# Patient Record
Sex: Female | Born: 1935
Health system: Southern US, Community
[De-identification: ages and names within clinical notes are randomized; demographics above are authoritative.]

## PROBLEM LIST (undated history)

## (undated) DIAGNOSIS — F419 Anxiety disorder, unspecified: Secondary | ICD-10-CM

## (undated) DIAGNOSIS — I10 Essential (primary) hypertension: Secondary | ICD-10-CM

## (undated) DIAGNOSIS — C4401 Basal cell carcinoma of skin of lip: Secondary | ICD-10-CM

## (undated) DIAGNOSIS — Z8739 Personal history of other diseases of the musculoskeletal system and connective tissue: Secondary | ICD-10-CM

## (undated) DIAGNOSIS — C801 Malignant (primary) neoplasm, unspecified: Secondary | ICD-10-CM

## (undated) DIAGNOSIS — E785 Hyperlipidemia, unspecified: Secondary | ICD-10-CM

## (undated) DIAGNOSIS — M1990 Unspecified osteoarthritis, unspecified site: Secondary | ICD-10-CM

## (undated) DIAGNOSIS — M810 Age-related osteoporosis without current pathological fracture: Secondary | ICD-10-CM

## (undated) HISTORY — PX: OOPHORECTOMY: SHX86

## (undated) HISTORY — DX: Personal history of other diseases of the musculoskeletal system and connective tissue: Z87.39

## (undated) HISTORY — PX: PAROTID GLAND TUMOR EXCISION: SHX5221

## (undated) HISTORY — DX: Basal cell carcinoma of skin of lip: C44.01

## (undated) HISTORY — DX: Essential (primary) hypertension: I10

## (undated) HISTORY — DX: Anxiety disorder, unspecified: F41.9

## (undated) HISTORY — DX: Age-related osteoporosis without current pathological fracture: M81.0

## (undated) HISTORY — DX: Unspecified osteoarthritis, unspecified site: M19.90

## (undated) HISTORY — DX: Hyperlipidemia, unspecified: E78.5

---

## 1976-10-01 HISTORY — PX: APPENDECTOMY: SHX54

## 1978-10-01 DIAGNOSIS — F419 Anxiety disorder, unspecified: Secondary | ICD-10-CM

## 1978-10-01 HISTORY — DX: Anxiety disorder, unspecified: F41.9

## 1998-08-29 ENCOUNTER — Ambulatory Visit (HOSPITAL_COMMUNITY): Admission: RE | Admit: 1998-08-29 | Discharge: 1998-08-29 | Payer: Self-pay | Admitting: *Deleted

## 1999-07-31 ENCOUNTER — Encounter: Payer: Self-pay | Admitting: Family Medicine

## 1999-07-31 ENCOUNTER — Ambulatory Visit (HOSPITAL_COMMUNITY): Admission: RE | Admit: 1999-07-31 | Discharge: 1999-07-31 | Payer: Self-pay | Admitting: Family Medicine

## 2000-08-05 ENCOUNTER — Ambulatory Visit (HOSPITAL_COMMUNITY): Admission: RE | Admit: 2000-08-05 | Discharge: 2000-08-05 | Payer: Self-pay | Admitting: Family Medicine

## 2000-08-05 ENCOUNTER — Encounter: Payer: Self-pay | Admitting: Family Medicine

## 2001-07-01 ENCOUNTER — Other Ambulatory Visit: Admission: RE | Admit: 2001-07-01 | Discharge: 2001-07-01 | Payer: Self-pay | Admitting: Family Medicine

## 2001-10-06 ENCOUNTER — Ambulatory Visit (HOSPITAL_COMMUNITY): Admission: RE | Admit: 2001-10-06 | Discharge: 2001-10-06 | Payer: Self-pay | Admitting: Family Medicine

## 2001-10-06 ENCOUNTER — Encounter: Payer: Self-pay | Admitting: Family Medicine

## 2002-10-01 HISTORY — PX: CATARACT EXTRACTION W/ INTRAOCULAR LENS IMPLANT: SHX1309

## 2002-11-30 ENCOUNTER — Ambulatory Visit (HOSPITAL_COMMUNITY): Admission: RE | Admit: 2002-11-30 | Discharge: 2002-11-30 | Payer: Self-pay | Admitting: Family Medicine

## 2002-11-30 ENCOUNTER — Encounter: Payer: Self-pay | Admitting: Family Medicine

## 2005-03-29 ENCOUNTER — Emergency Department: Payer: Self-pay | Admitting: Emergency Medicine

## 2005-07-27 ENCOUNTER — Ambulatory Visit: Payer: Self-pay | Admitting: Family Medicine

## 2006-02-18 ENCOUNTER — Ambulatory Visit: Payer: Self-pay | Admitting: Unknown Physician Specialty

## 2006-08-08 ENCOUNTER — Ambulatory Visit: Payer: Self-pay | Admitting: Family Medicine

## 2007-09-16 ENCOUNTER — Ambulatory Visit: Payer: Self-pay | Admitting: Family Medicine

## 2008-09-16 ENCOUNTER — Ambulatory Visit: Payer: Self-pay | Admitting: Family Medicine

## 2009-10-14 ENCOUNTER — Ambulatory Visit: Payer: Self-pay | Admitting: Family Medicine

## 2010-10-25 ENCOUNTER — Ambulatory Visit: Payer: Self-pay | Admitting: Family Medicine

## 2011-03-05 ENCOUNTER — Ambulatory Visit: Payer: Self-pay | Admitting: Oral Surgery

## 2011-03-21 ENCOUNTER — Ambulatory Visit: Payer: Self-pay | Admitting: Anesthesiology

## 2011-03-26 ENCOUNTER — Ambulatory Visit: Payer: Self-pay | Admitting: Otolaryngology

## 2011-03-27 LAB — PATHOLOGY REPORT

## 2011-11-22 ENCOUNTER — Ambulatory Visit: Payer: Self-pay | Admitting: Family Medicine

## 2012-10-01 HISTORY — PX: MOHS SURGERY: SUR867

## 2013-02-12 ENCOUNTER — Ambulatory Visit: Payer: Self-pay | Admitting: Unknown Physician Specialty

## 2013-02-16 ENCOUNTER — Ambulatory Visit: Payer: Self-pay | Admitting: Family Medicine

## 2013-02-17 LAB — PATHOLOGY REPORT

## 2013-10-01 HISTORY — PX: JOINT REPLACEMENT: SHX530

## 2013-10-01 HISTORY — PX: TOTAL KNEE ARTHROPLASTY: SHX125

## 2013-10-07 ENCOUNTER — Ambulatory Visit: Payer: Self-pay | Admitting: General Practice

## 2013-10-07 DIAGNOSIS — I10 Essential (primary) hypertension: Secondary | ICD-10-CM

## 2013-10-07 LAB — CBC
HCT: 42.1 % (ref 35.0–47.0)
HGB: 14.1 g/dL (ref 12.0–16.0)
MCH: 30.9 pg (ref 26.0–34.0)
MCHC: 33.5 g/dL (ref 32.0–36.0)
MCV: 92 fL (ref 80–100)
Platelet: 176 10*3/uL (ref 150–440)
RBC: 4.57 10*6/uL (ref 3.80–5.20)
RDW: 13.2 % (ref 11.5–14.5)
WBC: 5.7 10*3/uL (ref 3.6–11.0)

## 2013-10-07 LAB — SEDIMENTATION RATE: ERYTHROCYTE SED RATE: 6 mm/h (ref 0–30)

## 2013-10-07 LAB — URINALYSIS, COMPLETE
Bacteria: NONE SEEN
Bilirubin,UR: NEGATIVE
Blood: NEGATIVE
GLUCOSE, UR: NEGATIVE mg/dL (ref 0–75)
Ketone: NEGATIVE
Leukocyte Esterase: NEGATIVE
Nitrite: NEGATIVE
PROTEIN: NEGATIVE
Ph: 6 (ref 4.5–8.0)
Specific Gravity: 1.012 (ref 1.003–1.030)

## 2013-10-07 LAB — BASIC METABOLIC PANEL
Anion Gap: 4 — ABNORMAL LOW (ref 7–16)
BUN: 13 mg/dL (ref 7–18)
Calcium, Total: 9.5 mg/dL (ref 8.5–10.1)
Chloride: 104 mmol/L (ref 98–107)
Co2: 29 mmol/L (ref 21–32)
Creatinine: 0.63 mg/dL (ref 0.60–1.30)
EGFR (African American): >60
EGFR (Non-African Amer.): >60
Glucose: 89 mg/dL (ref 65–99)
Osmolality: 273 (ref 275–301)
Potassium: 3.4 mmol/L — ABNORMAL LOW (ref 3.5–5.1)
Sodium: 137 mmol/L (ref 136–145)

## 2013-10-07 LAB — APTT: Activated PTT: 27.4 secs (ref 23.6–35.9)

## 2013-10-07 LAB — PROTIME-INR
INR: 1.2
Prothrombin Time: 14.7 secs (ref 11.5–14.7)

## 2013-10-07 LAB — MRSA PCR SCREENING

## 2013-10-08 LAB — URINE CULTURE

## 2013-10-21 ENCOUNTER — Inpatient Hospital Stay: Payer: Self-pay | Admitting: General Practice

## 2013-10-22 LAB — BASIC METABOLIC PANEL
Anion Gap: 5 — ABNORMAL LOW (ref 7–16)
BUN: 15 mg/dL (ref 7–18)
CALCIUM: 8.2 mg/dL — AB (ref 8.5–10.1)
CHLORIDE: 107 mmol/L (ref 98–107)
CO2: 26 mmol/L (ref 21–32)
Creatinine: 0.87 mg/dL (ref 0.60–1.30)
EGFR (Non-African Amer.): >60
Glucose: 127 mg/dL — ABNORMAL HIGH (ref 65–99)
Osmolality: 278 (ref 275–301)
Potassium: 4.3 mmol/L (ref 3.5–5.1)
Sodium: 138 mmol/L (ref 136–145)

## 2013-10-22 LAB — PLATELET COUNT: Platelet: 173 10*3/uL (ref 150–440)

## 2013-10-22 LAB — HEMOGLOBIN: HGB: 12.6 g/dL (ref 12.0–16.0)

## 2013-10-23 LAB — BASIC METABOLIC PANEL
Anion Gap: 3 — ABNORMAL LOW (ref 7–16)
BUN: 14 mg/dL (ref 7–18)
CALCIUM: 8.7 mg/dL (ref 8.5–10.1)
CREATININE: 0.81 mg/dL (ref 0.60–1.30)
Chloride: 107 mmol/L (ref 98–107)
Co2: 27 mmol/L (ref 21–32)
EGFR (African American): >60
GLUCOSE: 103 mg/dL — AB (ref 65–99)
Osmolality: 275 (ref 275–301)
Potassium: 4.1 mmol/L (ref 3.5–5.1)
SODIUM: 137 mmol/L (ref 136–145)

## 2013-10-23 LAB — HEMOGLOBIN: HGB: 12.1 g/dL (ref 12.0–16.0)

## 2013-10-23 LAB — PLATELET COUNT: Platelet: 152 10*3/uL (ref 150–440)

## 2014-02-24 ENCOUNTER — Ambulatory Visit: Payer: Self-pay | Admitting: Family Medicine

## 2014-05-24 ENCOUNTER — Ambulatory Visit: Payer: Self-pay | Admitting: Family Medicine

## 2015-01-22 NOTE — Op Note (Signed)
PATIENT NAME:  Christy Mills, Christy Mills MR#:  [id number] DATE OF BIRTH:  [birth year]  DATE OF PROCEDURE:  10/21/2013  PREOPERATIVE DIAGNOSIS:  Degenerative arthrosis of the right knee.   POSTOPERATIVE DIAGNOSIS:  Degenerative arthrosis of the right knee.   PROCEDURE PERFORMED:  Right total knee arthroplasty using computer-assisted navigation.   SURGEON:  Laurice Record. Hooten, M.D.   ASSISTANT:  Vance Peper, PA (required to maintain retraction throughout the procedure).   ANESTHESIA:  Spinal.   ESTIMATED BLOOD LOSS:  50 mL.   FLUIDS REPLACED:  1400 mL of crystalloid.   TOURNIQUET TIME:  92 minutes.   DRAINS:  Two medium drains to reinfusion system.   SOFT TISSUE RELEASES:  Anterior cruciate ligament, posterior cruciate ligament, deep medial collateral ligament and patellofemoral ligament.   IMPLANTS UTILIZED:  DePuy PFC Sigma size three posterior stabilized femoral component (cemented), size three MBT tibial component (cemented), 35 mm three peg oval dome patella (cemented), and a 12.5 mm stabilized rotating platform polyethylene insert.   INDICATIONS FOR SURGERY:  The patient is a 81 year old female who has been seen for complaints of progressive right knee pain.  X-rays demonstrated severe degenerative changes in tricompartmental fashion with relative varus deformity.  After discussion of the risks and benefits of surgical intervention, the patient expressed understanding of the risks and benefits and agreed with plans for surgical intervention.   PROCEDURE IN DETAIL:  The patient was brought into the Operating Room and, after adequate spinal anesthesia was achieved, a tourniquet was placed on the patient's upper right thigh.  The patient's right knee and leg were cleaned and prepped with alcohol and DuraPrep, draped in the usual sterile fashion.  A "timeout" was performed as per usual protocol.  The right lower extremity was exsanguinated using an Esmarch, and the tourniquet was inflated to 300 mmHg.  An  anterior longitudinal incision was made followed by a standard mid vastus approach.  A moderate effusion was evacuated.  Deep fibers of the medial collateral ligament were elevated in a subperiosteal fashion off of the medial flare of the tibia.  The patella was subluxed laterally and the patellofemoral ligament was incised.  Inspection of the knee demonstrated severe degenerative changes in a tricompartmental fashion.  Prominent osteophytes were debrided using a rongeur.  The anterior and posterior cruciate ligaments were excised.  Two 4.0 mm Schanz pins were inserted and the femur and into the tibia for attachment of the ray of trackers used for computer-assisted navigation.  Hip center was identified using circumduction technique.  Distal landmarks were mapped using the computer.  The distal femur and proximal tibia were mapped using the computer.  Distal femoral cutting guide was positioned using computer-assisted navigation so as to achieve a 5 degree distal valgus cut.  Cut was performed and verified using the computer.  Distal femur was sized, and it was felt that a size three femur was appropriate.  A size three cutting guide was positioned and the anterior cut was performed and verified using the computer.  This was followed by completion of the posterior and chamfer cuts.  Femoral cutting guide for central box was then positioned and the central box cut was performed.  Attention was then directed to the proximal tibia.  Medial and lateral menisci were excised.  The extramedullary tibial cutting guide was positioned using computer-assisted navigation so as to achieve a 0 degree varus valgus alignment and 0 degree posterior slope.  Cut was performed and verified using the computer.  The proximal tibia  was sized and it was felt that a size three tibial tray was appropriate.  Tibial and femoral trials were inserted followed by insertion of first a 10 and subsequently a 12.5 mm polyethylene trial.  This allowed  for excellent mediolateral soft tissue balancing both in full extension and in flexion.  Finally, the patella was cut and prepared so as to accommodate a 35 mm three peg oval dome patella.  Patellar trial was placed and the knee was placed through a range of motion with excellent patellar tracking appreciated.  The femoral trial was removed.  Central post hole for the tibial component was reamed followed by insertion of a keel punch.  Tibial trial was then removed.  The cut surfaces of bone were irrigated with copious amounts of normal saline with antibiotic solution using pulsatile lavage and then suctioned dry.  Polymethyl methacrylate cement was prepared in the usual fashion using a vacuum mixer.  Cement was applied to the cut surface of the proximal tibia as well as along the undersurface of a size three MBT tibial component.  The tibial component was positioned and impacted into place.  Excess cement was removed using [employer] fast streamer.  Cement was then applied to the cut surface of the femur as well as on the posterior flanges of a size three posterior stabilized femoral component.  Femoral component was positioned and impacted into place.  Excess cement was removed using freer elevators.  A 12.5 mm polyethylene trial was inserted and the knee was brought in full extension with steady axial compression applied.  Finally, cement was applied to the backside of a 35 mm three peg oval dome patella, and the patellar component was positioned and patellar clamp applied.  Excess cement was removed using freer elevators.   After adequate curing of cement, the tourniquet was deflated after a total tourniquet time of 92 minutes.  Hemostasis was achieved using electrocautery.  The knee was irrigated with copious amounts of normal saline with antibiotic solution using pulsatile lavage and then suctioned dry.  The knee was inspected for any residual cement debris.  20 mL of 1.3% Exparel in 40 mL of normal saline was  injected along the posterior capsule, medial and lateral gutters, and along the arthrotomy site.  A 12.5 mm stabilized rotating platform polyethylene insert was inserted and the knee was placed through a range of motion.  Excellent mediolateral soft tissue balancing was appreciated and excellent patellar tracking appreciated.  Two medium drains were placed in the wound bed and brought out through a separate stab incision to be attached to a reinfusion system.  The medial parapatellar portion of the incision was reapproximated using interrupted sutures of #1 Vicryl.  The subcutaneous tissue was injected with 0.25% Marcaine with epinephrine.  The subcutaneous tissue was then reapproximated in layers using first #0 Vicryl followed by #2-0 Vicryl.  Skin was closed with skin staples.  A sterile dressing was applied.   The patient tolerated the procedure well.  She was transported to the recovery room in stable condition.    ____________________________ Laurice Record. Holley Bouche., MD jph:ea D: 10/22/2013 06:34:00 ET T: 10/22/2013 07:08:04 ET JOB#: [id number]  cc: Laurice Record. Holley Bouche., MD, <Dictator> JAMES P Holley Bouche MD ELECTRONICALLY SIGNED 11/20/2013 6:38

## 2015-01-22 NOTE — Discharge Summary (Signed)
PATIENT NAME:  Christy Mills, Christy Mills MR#:  [id number] DATE OF BIRTH:  25-Feb-1934  DATE OF ADMISSION:  10/21/2013 DATE OF DISCHARGE:  10/23/2013  ADMITTING DIAGNOSIS: Degenerative arthrosis of the right knee.   DISCHARGE DIAGNOSIS: Degenerative arthrosis of the right knee.   OPERATION: On 10/21/2013, the patient had a right total knee arthroplasty using computer-aided navigation.   SURGEON: Skip Estimable, M.D.   ASSISTANT: Vance Peper, PA.   ANESTHESIA: Spinal.   ESTIMATED BLOOD LOSS: 50 mL.   TOURNIQUET TIME: 92 minutes.   DRAINS: Two medium drains to reinfusion system.   IMPLANTS USED: DePuy PFC Sigma size 3 posterior stabilized femoral component that was cemented, size 3 MBT tibial component that was cemented, 35 mm 3 peg oval dome patella that was cemented, a 12.5 mm stabilized rotating platform polyethylene insert.   The patient was stabilized, brought to the recovery room and then brought down to the orthopedic floor.   HISTORY AND PHYSICAL: HISTORY: The patient is a 81 year old female who presented for an upcoming total knee replacement on the right knee. The patient has a long history of progressive right knee pain. It has been refractory to conservative treatment, including arthroscopy, cortisone injections and anti-inflammatories. The patient has had difficulties with daily living.   PHYSICAL EXAMINATION:  GENERAL: Alert female with an antalgic gait and a varus thrust to the right knee.  LUNGS: Clear to auscultation.  CARDIOVASCULAR: Normal sinus rhythm.  MUSCULOSKELETAL: In regard to the right knee, the patient has no effusion. The patient does have medial joint line tenderness and no instability. The patient has range of motion with full extension to 122 degrees of flexion.   HOSPITAL COURSE: After initial admission on 10/21/2013, the patient was brought to the orthopedic floor. The patient on day one was ambulating bed to chair and progressed up to ambulating around the nurse's  station. She was ready to go home with home health physical therapy on 10/23/2013. The patient had no other specific complications.   CONDITION AT DISCHARGE: Stable.   DISPOSITION: The patient was sent home with home health physical therapy.   DISCHARGE INSTRUCTIONS: The patient will follow up with [facility] on 11/05/2013 with Vance Peper. The patient will do weight bear as tolerated on the affected leg. The patient will raise her foot with 1 to 2 pillows. The patient will use thigh high TED hose on both legs, removed at bedtime. The patient will keep her heels off the bed and be encouraged to do cough and deep breathing. The patient will do a regular diet. The patient will use a Polar Care to decrease swelling, try to keep her dressing clean and dry, try not to get it dirty. The patient will do physical therapy as per protocol and will have a dressing change with physical therapy. The patient will call the clinic if there is any bright red bleeding, calf pain, bowel or bladder difficulty or fever greater than 101.5.   DISCHARGE MEDICATIONS: Resume home medications and then to add Celebrex 200 mg 1 tablet b.i.d., Tylenol 500 mg q.4 hours as needed for pain or fever greater than 100.4, oxycodone 5 mg 1 tablet q.4 hours for severe pain, tramadol 50 mg 1 tablet q.4 hours as needed for less severe pain and Lovenox 40 mg subcutaneous once a day for 14 days, then discontinue and begin an aspirin 81 mg once a day.    ____________________________ Lenna Sciara. Reche Dixon, [place] jtm:gb D: 10/23/2013 06:05:28 ET T: 10/23/2013 06:19:01 ET JOB#: [id number]  cc: Anitra Lauth, PA, <Dictator> J Keean Wilmeth PA ELECTRONICALLY SIGNED 10/26/2013 10:07

## 2015-02-11 ENCOUNTER — Other Ambulatory Visit: Payer: Self-pay | Admitting: Family Medicine

## 2015-02-11 DIAGNOSIS — Z1231 Encounter for screening mammogram for malignant neoplasm of breast: Secondary | ICD-10-CM

## 2015-03-03 ENCOUNTER — Ambulatory Visit
Admission: RE | Admit: 2015-03-03 | Discharge: 2015-03-03 | Disposition: A | Discharge status: Home / Self Care | Payer: Medicare PPO | Source: Ambulatory Visit | Attending: Family Medicine | Admitting: Family Medicine

## 2015-03-03 DIAGNOSIS — Z1231 Encounter for screening mammogram for malignant neoplasm of breast: Secondary | ICD-10-CM | POA: Insufficient documentation

## 2015-03-03 DIAGNOSIS — R922 Inconclusive mammogram: Secondary | ICD-10-CM | POA: Insufficient documentation

## 2015-03-24 ENCOUNTER — Other Ambulatory Visit: Payer: Self-pay | Admitting: Family Medicine

## 2015-03-24 DIAGNOSIS — E785 Hyperlipidemia, unspecified: Secondary | ICD-10-CM | POA: Insufficient documentation

## 2015-03-24 DIAGNOSIS — I10 Essential (primary) hypertension: Secondary | ICD-10-CM

## 2015-03-24 DIAGNOSIS — M810 Age-related osteoporosis without current pathological fracture: Secondary | ICD-10-CM | POA: Insufficient documentation

## 2015-03-31 ENCOUNTER — Encounter: Payer: Self-pay | Admitting: Family Medicine

## 2015-05-25 ENCOUNTER — Other Ambulatory Visit: Payer: Self-pay | Admitting: Family Medicine

## 2015-05-25 DIAGNOSIS — F419 Anxiety disorder, unspecified: Secondary | ICD-10-CM

## 2015-05-26 DIAGNOSIS — F419 Anxiety disorder, unspecified: Secondary | ICD-10-CM | POA: Insufficient documentation

## 2015-05-26 NOTE — Telephone Encounter (Signed)
Ok to call in rx.  Thanks.  

## 2015-05-26 NOTE — Telephone Encounter (Signed)
Last ov 01/2015

## 2015-08-17 ENCOUNTER — Other Ambulatory Visit: Payer: Self-pay | Admitting: Family Medicine

## 2015-08-17 DIAGNOSIS — I10 Essential (primary) hypertension: Secondary | ICD-10-CM

## 2015-08-17 NOTE — Telephone Encounter (Signed)
Last OV 01/2015  Thanks,   -Christy Mills  

## 2015-08-19 ENCOUNTER — Other Ambulatory Visit: Payer: Self-pay | Admitting: Family Medicine

## 2015-08-19 DIAGNOSIS — F419 Anxiety disorder, unspecified: Secondary | ICD-10-CM

## 2015-08-19 NOTE — Telephone Encounter (Signed)
Last OV 02/18/2015

## 2015-08-19 NOTE — Telephone Encounter (Signed)
Ok to call in rx.  Thanks.  

## 2015-09-12 ENCOUNTER — Ambulatory Visit (INDEPENDENT_AMBULATORY_CARE_PROVIDER_SITE_OTHER): Payer: Medicare PPO | Admitting: Family Medicine

## 2015-09-12 ENCOUNTER — Encounter: Payer: Self-pay | Admitting: Family Medicine

## 2015-09-12 VITALS — BP 124/62 | HR 65 | Temp 97.6°F | Resp 16 | Wt 205.4 lb

## 2015-09-12 DIAGNOSIS — M1990 Unspecified osteoarthritis, unspecified site: Secondary | ICD-10-CM | POA: Insufficient documentation

## 2015-09-12 DIAGNOSIS — I10 Essential (primary) hypertension: Secondary | ICD-10-CM | POA: Insufficient documentation

## 2015-09-12 DIAGNOSIS — E876 Hypokalemia: Secondary | ICD-10-CM | POA: Insufficient documentation

## 2015-09-12 DIAGNOSIS — E7800 Pure hypercholesterolemia, unspecified: Secondary | ICD-10-CM | POA: Insufficient documentation

## 2015-09-12 DIAGNOSIS — J069 Acute upper respiratory infection, unspecified: Secondary | ICD-10-CM

## 2015-09-12 MED ORDER — HYDROCODONE-HOMATROPINE 5-1.5 MG/5ML PO SYRP: ORAL_SOLUTION | ORAL | Status: DC

## 2015-09-12 NOTE — Patient Instructions (Signed)
Discussed use of Sudafed PE, Mucinex, saline spray, and Delsym.

## 2015-09-12 NOTE — Progress Notes (Signed)
Subjective:     Patient ID: Christy Mills, female   DOB: Oct 12, 1933, 81 y.o.   MRN: [id number]  HPI  Chief Complaint  Patient presents with  . Cough    Patient comes in office today with concerns of cough and congestion x 24hrs. Patient states that she has sinus pressure (entire head) and nasal congestion. Patient reports taking otc Robitussin and allergy relief     Review of Systems  Constitutional: Negative for fever and chills.       Objective:   Physical Exam  Constitutional: She appears well-developed and well-nourished. No distress.  HENT:  Hard of hearing with bilateral hearing aides  Ears: T.M's intact without inflammation Throat: no tonsillar enlargement or exudate Neck: no cervical adenopathy Lungs: clear     Assessment:    1. Upper respiratory infection - HYDROcodone-homatropine (HYCODAN) 5-1.5 MG/5ML syrup; 5 ml 6-8 hours as needed for cough  Dispense: 240 mL; Refill: 0    Plan:    Discussed otc medication use.

## 2015-09-19 ENCOUNTER — Telehealth: Payer: Self-pay | Admitting: Family Medicine

## 2015-09-19 ENCOUNTER — Other Ambulatory Visit: Payer: Self-pay | Admitting: Family Medicine

## 2015-09-19 DIAGNOSIS — J019 Acute sinusitis, unspecified: Secondary | ICD-10-CM

## 2015-09-19 MED ORDER — AMOXICILLIN-POT CLAVULANATE 875-125 MG PO TABS
1.0000 | ORAL_TABLET | Freq: Two times a day (BID) | ORAL | Status: DC
Start: 2015-09-19 — End: 2015-12-19

## 2015-09-19 NOTE — Telephone Encounter (Signed)
Please review office note and advise. KW 

## 2015-09-19 NOTE — Telephone Encounter (Signed)
Pt states she was seen last week for a cough.  Pt states as od Thursday she started having sinus congestion and sinus pressure. Pt is requesting a Rx to help with this.  Pt states he cough is better.  [facility].  [id number]

## 2015-09-19 NOTE — Telephone Encounter (Signed)
Patient has been notified. KW

## 2015-09-19 NOTE — Telephone Encounter (Signed)
Antibiotic has been sent in to cover the possibility of sinus infection.

## 2015-10-12 ENCOUNTER — Other Ambulatory Visit: Payer: Self-pay | Admitting: Family Medicine

## 2015-10-12 DIAGNOSIS — E785 Hyperlipidemia, unspecified: Secondary | ICD-10-CM

## 2015-10-12 DIAGNOSIS — I10 Essential (primary) hypertension: Secondary | ICD-10-CM

## 2015-11-16 ENCOUNTER — Other Ambulatory Visit: Payer: Self-pay | Admitting: Family Medicine

## 2015-11-18 ENCOUNTER — Other Ambulatory Visit: Payer: Self-pay | Admitting: Family Medicine

## 2015-11-18 DIAGNOSIS — F419 Anxiety disorder, unspecified: Secondary | ICD-10-CM

## 2015-11-18 NOTE — Telephone Encounter (Signed)
Printed, please fax or call in to pharmacy. Thank you.   

## 2015-11-18 NOTE — Telephone Encounter (Signed)
Pt called checking on the refills for her LORazepam (ATIVAN) 1 MG tablet raloxifene (EVISTA) 60 MG tablet  PT Call back [phone]   Thanks teri

## 2015-11-23 ENCOUNTER — Other Ambulatory Visit: Payer: Self-pay | Admitting: Family Medicine

## 2015-11-23 DIAGNOSIS — M810 Age-related osteoporosis without current pathological fracture: Secondary | ICD-10-CM

## 2015-11-23 MED ORDER — RALOXIFENE HCL 60 MG PO TABS
60.0000 mg | ORAL_TABLET | Freq: Every day | ORAL | Status: DC
Start: 2015-11-23 — End: 2016-02-18

## 2015-11-23 NOTE — Telephone Encounter (Signed)
Pt has only been seen recently for acute problems. Last FU/CPE was August 2015. Renaldo Fiddler, CMA

## 2015-11-23 NOTE — Telephone Encounter (Signed)
LMTCB

## 2015-11-23 NOTE — Telephone Encounter (Signed)
Sent rx. Please notify patient needs Wellness scheduled in next month or so. Thanks.

## 2015-11-23 NOTE — Telephone Encounter (Signed)
Pt contacted office for refill request on the following medications: raloxifene (EVISTA) [facility] MG [facility] stated she has been trying to get this refilled for over a week. Pt would like it sent in as soon as possible for 90 supply. Pt would like a call back if this can not be refilled. Please advise. Thanks TNP

## 2015-12-19 ENCOUNTER — Encounter: Payer: Self-pay | Admitting: Family Medicine

## 2015-12-19 ENCOUNTER — Ambulatory Visit (INDEPENDENT_AMBULATORY_CARE_PROVIDER_SITE_OTHER): Payer: PPO | Admitting: Family Medicine

## 2015-12-19 VITALS — BP 120/64 | HR 74 | Temp 98.4°F | Resp 16 | Ht 63.0 in | Wt 205.0 lb

## 2015-12-19 DIAGNOSIS — R7309 Other abnormal glucose: Secondary | ICD-10-CM | POA: Diagnosis not present

## 2015-12-19 DIAGNOSIS — F419 Anxiety disorder, unspecified: Secondary | ICD-10-CM

## 2015-12-19 DIAGNOSIS — I10 Essential (primary) hypertension: Secondary | ICD-10-CM | POA: Diagnosis not present

## 2015-12-19 DIAGNOSIS — R350 Frequency of micturition: Secondary | ICD-10-CM | POA: Diagnosis not present

## 2015-12-19 DIAGNOSIS — K219 Gastro-esophageal reflux disease without esophagitis: Secondary | ICD-10-CM

## 2015-12-19 DIAGNOSIS — Z0000 Encounter for general adult medical examination without abnormal findings: Secondary | ICD-10-CM

## 2015-12-19 DIAGNOSIS — E7800 Pure hypercholesterolemia, unspecified: Secondary | ICD-10-CM

## 2015-12-19 MED ORDER — LISINOPRIL 40 MG PO TABS: 40.0000 mg | ORAL_TABLET | Freq: Every day | ORAL | Status: DC

## 2015-12-19 MED ORDER — LORAZEPAM 1 MG PO TABS: 1.0000 mg | ORAL_TABLET | Freq: Every day | ORAL | Status: DC

## 2015-12-19 MED ORDER — OMEPRAZOLE 20 MG PO CPDR: 20.0000 mg | DELAYED_RELEASE_CAPSULE | Freq: Every day | ORAL | Status: DC

## 2015-12-19 NOTE — Progress Notes (Signed)
Patient ID: Christy Mills, female   DOB: [birth year], 82 y.o.   MRN: [id number]       Patient: Christy Mills, Female    DOB: [birth year], 82 y.o.   MRN: [id number] Visit Date: 12/19/2015  Today's Provider: Margarita Rana, MD   Chief Complaint  Patient presents with  . Medicare Wellness  . Gastroesophageal Reflux   Subjective:    Annual wellness visit Christy Mills is a 82 y.o. female. She feels well. She reports exercising 3-4 days a week. She reports she is sleeping well.  05/14/15 CPE 03/23/15 Mammogram-BI-RADS 1 05/24/14 BMD-osteoporosis 03/014/11 Pap-neg HPV-neg 02/12/13 Colonoscopy-polyp, diverticulosis, internal hemorrhoids, recheck 01/2018 -----------------------------------------------------------  GERD: Paitent complains of heartburn. This has been associated with heartburn.  She denies unexpected weight loss. Symptoms have been present for 1 year. She denies dysphagia.  She has not lost weight. She denies melena, hematochezia, hematemesis, and coffee ground emesis. Medical therapy in the past has included omeprazole 20 mg. Patient is requesting prescription.  Bladder problem:  Patient reports that she has been have frequency and the feeling of not voiding completely. Patient reports this has been going on for a few years. Patient denies pain or burning with urination.  Review of Systems  Constitutional: Negative.   HENT: Negative.   Eyes: Negative.   Respiratory: Negative.   Cardiovascular: Negative.   Gastrointestinal: Negative.   Endocrine: Negative.   Genitourinary: Positive for frequency and enuresis.  Musculoskeletal: Positive for arthralgias.  Skin: Negative.   Allergic/Immunologic: Negative.   Neurological: Negative.   Hematological: Bruises/bleeds easily.  Psychiatric/Behavioral: Negative.     Social History   Social History  . Marital Status: Married    Spouse Name: N/A  . Number of Children: N/A  . Years of Education: N/A   Occupational History    . Not on file.   Social History Main Topics  . Smoking status: Former Smoker    Types: Cigarettes  . Smokeless tobacco: Never Used     Comment: quit in 1992  . Alcohol Use: No  . Drug Use: No  . Sexual Activity: Not on file   Other Topics Concern  . Not on file   Social History Narrative    Past Medical History  Diagnosis Date  . Arthritis   . Hyperlipidemia   . Hypertension   . Osteoporosis   . Anxiety   . Hypokalemia   . History of trigger finger      Patient Active Problem List   Diagnosis Date Noted  . Arthritis 09/12/2015  . Hypercholesterolemia 09/12/2015  . BP (high blood pressure) 09/12/2015  . Anxiety 05/26/2015  . Hyperlipemia 03/24/2015  . Osteoporosis 03/24/2015  . Hypertension 03/24/2015    Past Surgical History  Procedure Laterality Date  . Total knee arthroplasty Right 10/2013  . Appendectomy  1978  . 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 Oophorectomy Right     Her family history includes Breast cancer (age of onset: 57) in her mother; Cancer in her brother and father; Colon polyps in her father; Diabetes in her brother and father; Hypertension in her father and mother.    Previous Medications   ACETAMINOPHEN (TYLENOL) 500 MG TABLET    Take 500 mg by mouth every 6 (six) hours as needed.   AMLODIPINE (NORVASC) 5 MG TABLET    TAKE 1 TABLET BY MOUTH EVERY DAY   CALCIUM CARBONATE-VIT D-MIN (CALCIUM 1200 PO)    Take 1  capsule by mouth daily.   GLUCOSAMINE 500 MG CAPS    Take 1 capsule by mouth daily.   LISINOPRIL (PRINIVIL,ZESTRIL) 40 MG TABLET    TAKE 1 TABLET BY MOUTH DAILY   LORAZEPAM (ATIVAN) 1 MG TABLET    TAKE 1 TABLET BY MOUTH EVERY DAY   MULTIPLE VITAMIN (MULTIVITAMIN) CAPSULE    Take 1 capsule by mouth daily.   RALOXIFENE (EVISTA) 60 MG TABLET    Take 1 tablet (60 mg total) by mouth daily.   SIMVASTATIN (ZOCOR) 20 MG TABLET    TAKE 1 TABLET BY MOUTH EVERY DAY    Patient Care Team: Margarita Rana, MD as  PCP - General (Family Medicine)     Objective:   Vitals: BP 120/64 mmHg  Pulse 74  Temp(Src) 98.4 F (36.9 C) (Oral)  Resp 16  Ht 5\' 3"  (1.6 m)  Wt 205 lb (92.987 kg)  BMI 36.32 kg/m2  SpO2 96%  Physical Exam  Constitutional: She is oriented to person, place, and time. She appears well-developed and well-nourished.  HENT:  Head: Normocephalic and atraumatic.  Right Ear: Tympanic membrane, external ear and ear canal normal.  Left Ear: Tympanic membrane, external ear and ear canal normal.  Nose: Nose normal.  Mouth/Throat: Uvula is midline, oropharynx is clear and moist and mucous membranes are normal.  Eyes: Conjunctivae, EOM and lids are normal. Pupils are equal, round, and reactive to light.  Neck: Trachea normal and normal range of motion. Neck supple. Carotid bruit is not present. No thyroid mass and no thyromegaly present.  Cardiovascular: Normal rate, regular rhythm and normal heart sounds.   Pulmonary/Chest: Effort normal and breath sounds normal.  Abdominal: Soft. Normal appearance and bowel sounds are normal. There is no hepatosplenomegaly. There is no tenderness.  Genitourinary: No breast swelling, tenderness or discharge.  Musculoskeletal: Normal range of motion.  Lymphadenopathy:    She has no cervical adenopathy.    She has no axillary adenopathy.  Neurological: She is alert and oriented to person, place, and time. She has normal strength. No cranial nerve deficit.  Skin: Skin is warm, dry and intact.  Psychiatric: She has a normal mood and affect. Her speech is normal and behavior is normal. Judgment and thought content normal. Cognition and memory are normal.    Activities of Daily Living In your present state of health, do you have any difficulty performing the following activities: 12/19/2015  Hearing? Y  Vision? N  Difficulty concentrating or making decisions? N  Walking or climbing stairs? Y  Dressing or bathing? N  Doing errands, shopping? N    Fall  Risk Assessment Fall Risk  12/19/2015  Falls in the past year? No     Depression Screen PHQ 2/9 Scores 12/19/2015  PHQ - 2 Score 0    Cognitive Testing - 6-CIT  Correct? Score   What year is it? yes 0 0 or 4  What month is it? yes 0 0 or 3  Memorize:    Pia Mau,  44,  [place],      What time is it? (within 1 hour) yes 0 0 or 3  Count backwards from 20 yes 0 0, 2, or 4  Name the months of the year yes 0 0, 2, or 4  Repeat name & address above yes 0 0, 2, 4, 6, 8, or 10       TOTAL SCORE  0/28   Interpretation:  Normal  Normal (0-7) Abnormal (8-28)  Assessment & Plan:     Annual Wellness Visit  Reviewed patient's Family Medical History Reviewed and updated list of patient's medical providers Assessment of cognitive impairment was done Assessed patient's functional ability Established a written schedule for health screening [place] Completed and Reviewed  Exercise Activities and Dietary recommendations Goals    None          1. Medicare annual wellness visit, subsequent Stable. Patient advised to continue eating healthy and exercise daily.  2. Essential hypertension Stable. Patient advised to continue current medication and plan of care. - CBC with Differential/Platelet - Comprehensive metabolic panel - lisinopril (PRINIVIL,ZESTRIL) 40 MG tablet; Take 1 tablet (40 mg total) by mouth daily.  Dispense: 90 tablet; Refill: 3  3. Hypercholesterolemia F/U pending lab report. - Lipid Panel With LDL/HDL Ratio - TSH  4. Abnormal blood sugar - Hemoglobin A1c  5. Gastroesophageal reflux disease, esophagitis presence not specified New problem. Patient started on omeprazole 20 mg daily as below. - omeprazole (PRILOSEC) 20 MG capsule; Take 1 capsule (20 mg total) by mouth daily.  Dispense: 90 capsule; Refill: 3  6. Anxiety Stable. Patient advised to continue current medication and plan of care. - LORazepam (ATIVAN) 1 MG  tablet; Take 1 tablet (1 mg total) by mouth daily.  Dispense: 90 tablet; Refill: 1   7. Urinary frequency Recurrent.     Patient seen and examined by Dr. Jerrell Belfast, and note scribed by Philbert Riser. Dim[place], CMA.  I have reviewed the document for accuracy and completeness and I agree with above. Jerrell Belfast, MD   Margarita Rana, MD    ------------------------------------------------------------------------------------------------------------

## 2015-12-21 DIAGNOSIS — I10 Essential (primary) hypertension: Secondary | ICD-10-CM | POA: Diagnosis not present

## 2015-12-21 DIAGNOSIS — R7309 Other abnormal glucose: Secondary | ICD-10-CM | POA: Diagnosis not present

## 2015-12-21 DIAGNOSIS — E7800 Pure hypercholesterolemia, unspecified: Secondary | ICD-10-CM | POA: Diagnosis not present

## 2015-12-22 ENCOUNTER — Telehealth: Payer: Self-pay

## 2015-12-22 LAB — CBC WITH DIFFERENTIAL/PLATELET
BASOS ABS: 0 10*3/uL (ref 0.0–0.2)
Basos: 1 %
EOS (ABSOLUTE): 0.2 10*3/uL (ref 0.0–0.4)
Eos: 3 %
HEMOGLOBIN: 15 g/dL (ref 11.1–15.9)
Hematocrit: 45.5 % (ref 34.0–46.6)
IMMATURE GRANS (ABS): 0 10*3/uL (ref 0.0–0.1)
Immature Granulocytes: 0 %
LYMPHS: 30 %
Lymphocytes Absolute: 1.7 10*3/uL (ref 0.7–3.1)
MCH: 30.9 pg (ref 26.6–33.0)
MCHC: 33 g/dL (ref 31.5–35.7)
MCV: 94 fL (ref 79–97)
MONOCYTES: 9 %
Monocytes Absolute: 0.5 10*3/uL (ref 0.1–0.9)
Neutrophils Absolute: 3.2 10*3/uL (ref 1.4–7.0)
Neutrophils: 57 %
PLATELETS: 192 10*3/uL (ref 150–379)
RBC: 4.86 x10E6/uL (ref 3.77–5.28)
RDW: 14 % (ref 12.3–15.4)
WBC: 5.6 10*3/uL (ref 3.4–10.8)

## 2015-12-22 LAB — LIPID PANEL WITH LDL/HDL RATIO
CHOLESTEROL TOTAL: 151 mg/dL (ref 100–199)
HDL: 41 mg/dL (ref >39)
LDL Calculated: 85 mg/dL (ref 0–99)
LDl/HDL Ratio: 2.1 ratio units (ref 0.0–3.2)
TRIGLYCERIDES: 125 mg/dL (ref 0–149)
VLDL Cholesterol Cal: 25 mg/dL (ref 5–40)

## 2015-12-22 LAB — COMPREHENSIVE METABOLIC PANEL WITH GFR
ALT: 16 [IU]/L (ref 0–32)
AST: 18 [IU]/L (ref 0–40)
Albumin/Globulin Ratio: 1.4 (ref 1.2–2.2)
Albumin: 3.9 g/dL (ref 3.5–4.8)
Alkaline Phosphatase: 63 [IU]/L (ref 39–117)
BUN/Creatinine Ratio: 21 (ref 11–26)
BUN: 15 mg/dL (ref 8–27)
Bilirubin Total: 0.4 mg/dL (ref 0.0–1.2)
CO2: 24 mmol/L (ref 18–29)
Calcium: 8.9 mg/dL (ref 8.7–10.3)
Chloride: 105 mmol/L (ref 96–106)
Creatinine, Ser: 0.7 mg/dL (ref 0.57–1.00)
GFR calc Af Amer: 95 mL/min/{1.73_m2}
GFR calc non Af Amer: 83 mL/min/{1.73_m2}
Globulin, Total: 2.7 g/dL (ref 1.5–4.5)
Glucose: 98 mg/dL (ref 65–99)
Potassium: 4.3 mmol/L (ref 3.5–5.2)
Sodium: 143 mmol/L (ref 134–144)
Total Protein: 6.6 g/dL (ref 6.0–8.5)

## 2015-12-22 LAB — TSH: TSH: 1.84 u[IU]/mL (ref 0.450–4.500)

## 2015-12-22 LAB — HEMOGLOBIN A1C
Est. average glucose Bld gHb Est-mCnc: 120 mg/dL
Hgb A1c MFr Bld: 5.8 % — ABNORMAL HIGH (ref 4.8–5.6)

## 2015-12-22 NOTE — Telephone Encounter (Signed)
Advised pt of lab results. Pt verbally acknowledges understanding. Emily Drozdowski, CMA   

## 2015-12-22 NOTE — Telephone Encounter (Signed)
-----   Message from Margarita Rana, MD sent at 12/22/2015  7:48 AM EDT ----- Labs all stable. Please notify patient. Thanks.

## 2016-02-18 ENCOUNTER — Other Ambulatory Visit: Payer: Self-pay | Admitting: Family Medicine

## 2016-03-19 DIAGNOSIS — Z9842 Cataract extraction status, left eye: Secondary | ICD-10-CM | POA: Diagnosis not present

## 2016-03-19 DIAGNOSIS — I10 Essential (primary) hypertension: Secondary | ICD-10-CM | POA: Diagnosis not present

## 2016-03-19 DIAGNOSIS — Z83511 Family history of glaucoma: Secondary | ICD-10-CM | POA: Diagnosis not present

## 2016-03-19 DIAGNOSIS — Z9841 Cataract extraction status, right eye: Secondary | ICD-10-CM | POA: Diagnosis not present

## 2016-03-19 DIAGNOSIS — H35033 Hypertensive retinopathy, bilateral: Secondary | ICD-10-CM | POA: Diagnosis not present

## 2016-03-21 ENCOUNTER — Other Ambulatory Visit: Payer: Self-pay | Admitting: Family Medicine

## 2016-03-21 DIAGNOSIS — Z1231 Encounter for screening mammogram for malignant neoplasm of breast: Secondary | ICD-10-CM

## 2016-04-11 ENCOUNTER — Other Ambulatory Visit: Payer: Self-pay | Admitting: Family Medicine

## 2016-04-11 DIAGNOSIS — I10 Essential (primary) hypertension: Secondary | ICD-10-CM

## 2016-04-11 DIAGNOSIS — E785 Hyperlipidemia, unspecified: Secondary | ICD-10-CM

## 2016-04-11 MED ORDER — AMLODIPINE BESYLATE 5 MG PO TABS: 5.0000 mg | ORAL_TABLET | Freq: Every day | ORAL | Status: DC

## 2016-04-11 MED ORDER — SIMVASTATIN 20 MG PO TABS: 20.0000 mg | ORAL_TABLET | Freq: Every day | ORAL | Status: DC

## 2016-04-13 ENCOUNTER — Other Ambulatory Visit: Payer: Self-pay | Admitting: Family Medicine

## 2016-04-13 ENCOUNTER — Ambulatory Visit
Admission: RE | Admit: 2016-04-13 | Discharge: 2016-04-13 | Disposition: A | Discharge status: Home / Self Care | Payer: PPO | Source: Ambulatory Visit | Attending: Family Medicine | Admitting: Family Medicine

## 2016-04-13 DIAGNOSIS — Z1231 Encounter for screening mammogram for malignant neoplasm of breast: Secondary | ICD-10-CM

## 2016-04-18 DIAGNOSIS — L918 Other hypertrophic disorders of the skin: Secondary | ICD-10-CM | POA: Diagnosis not present

## 2016-04-18 DIAGNOSIS — D1801 Hemangioma of skin and subcutaneous tissue: Secondary | ICD-10-CM | POA: Diagnosis not present

## 2016-04-18 DIAGNOSIS — L821 Other seborrheic keratosis: Secondary | ICD-10-CM | POA: Diagnosis not present

## 2016-04-24 ENCOUNTER — Encounter: Payer: Self-pay | Admitting: Family Medicine

## 2016-04-29 ENCOUNTER — Emergency Department
Admission: EM | Admit: 2016-04-29 | Discharge: 2016-04-29 | Disposition: A | Discharge status: Home / Self Care | Payer: PPO | Attending: Emergency Medicine | Admitting: Emergency Medicine

## 2016-04-29 ENCOUNTER — Emergency Department: Payer: PPO

## 2016-04-29 ENCOUNTER — Encounter: Payer: Self-pay | Admitting: Emergency Medicine

## 2016-04-29 DIAGNOSIS — K529 Noninfective gastroenteritis and colitis, unspecified: Secondary | ICD-10-CM | POA: Insufficient documentation

## 2016-04-29 DIAGNOSIS — E785 Hyperlipidemia, unspecified: Secondary | ICD-10-CM | POA: Insufficient documentation

## 2016-04-29 DIAGNOSIS — Z79899 Other long term (current) drug therapy: Secondary | ICD-10-CM | POA: Insufficient documentation

## 2016-04-29 DIAGNOSIS — I10 Essential (primary) hypertension: Secondary | ICD-10-CM | POA: Insufficient documentation

## 2016-04-29 DIAGNOSIS — K5900 Constipation, unspecified: Secondary | ICD-10-CM | POA: Insufficient documentation

## 2016-04-29 DIAGNOSIS — N2889 Other specified disorders of kidney and ureter: Secondary | ICD-10-CM | POA: Diagnosis not present

## 2016-04-29 DIAGNOSIS — R1084 Generalized abdominal pain: Secondary | ICD-10-CM | POA: Diagnosis not present

## 2016-04-29 DIAGNOSIS — K625 Hemorrhage of anus and rectum: Secondary | ICD-10-CM | POA: Diagnosis not present

## 2016-04-29 DIAGNOSIS — Z87891 Personal history of nicotine dependence: Secondary | ICD-10-CM | POA: Insufficient documentation

## 2016-04-29 HISTORY — DX: Malignant (primary) neoplasm, unspecified: C80.1

## 2016-04-29 LAB — URINALYSIS COMPLETE WITH MICROSCOPIC (ARMC ONLY)
BACTERIA UA: NONE SEEN
BILIRUBIN URINE: NEGATIVE
Glucose, UA: NEGATIVE mg/dL
HGB URINE DIPSTICK: NEGATIVE
Ketones, ur: NEGATIVE mg/dL
LEUKOCYTES UA: NEGATIVE
Nitrite: NEGATIVE
PH: 5 (ref 5.0–8.0)
PROTEIN: NEGATIVE mg/dL
Specific Gravity, Urine: 1.021 (ref 1.005–1.030)

## 2016-04-29 LAB — CBC
HEMATOCRIT: 44.2 % (ref 35.0–47.0)
HEMOGLOBIN: 15.4 g/dL (ref 12.0–16.0)
MCH: 31.2 pg (ref 26.0–34.0)
MCHC: 34.8 g/dL (ref 32.0–36.0)
MCV: 89.8 fL (ref 80.0–100.0)
PLATELETS: 166 10*3/uL (ref 150–440)
RBC: 4.92 MIL/uL (ref 3.80–5.20)
RDW: 14 % (ref 11.5–14.5)
WBC: 12.8 10*3/uL — AB (ref 3.6–11.0)

## 2016-04-29 LAB — COMPREHENSIVE METABOLIC PANEL
ALT: 15 U/L (ref 14–54)
ANION GAP: 8 (ref 5–15)
AST: 19 U/L (ref 15–41)
Albumin: 3.7 g/dL (ref 3.5–5.0)
Alkaline Phosphatase: 83 U/L (ref 38–126)
BUN: 19 mg/dL (ref 6–20)
CHLORIDE: 108 mmol/L (ref 101–111)
CO2: 24 mmol/L (ref 22–32)
Calcium: 9.1 mg/dL (ref 8.9–10.3)
Creatinine, Ser: 0.68 mg/dL (ref 0.44–1.00)
Glucose, Bld: 100 mg/dL — ABNORMAL HIGH (ref 65–99)
POTASSIUM: 3.6 mmol/L (ref 3.5–5.1)
SODIUM: 140 mmol/L (ref 135–145)
Total Bilirubin: 0.5 mg/dL (ref 0.3–1.2)
Total Protein: 7 g/dL (ref 6.5–8.1)

## 2016-04-29 LAB — LIPASE, BLOOD: LIPASE: 18 U/L (ref 11–51)

## 2016-04-29 MED ORDER — IOPAMIDOL (ISOVUE-300) INJECTION 61%
100.0000 mL | Freq: Once | INTRAVENOUS | Status: AC | PRN
  Administered 2016-04-29: 100 mL via INTRAVENOUS

## 2016-04-29 MED ORDER — DIATRIZOATE MEGLUMINE & SODIUM 66-10 % PO SOLN
15.0000 mL | Freq: Once | ORAL | Status: AC
  Administered 2016-04-29: 15 mL via ORAL

## 2016-04-29 MED ORDER — METRONIDAZOLE 500 MG PO TABS: 500.0000 mg | ORAL_TABLET | Freq: Three times a day (TID) | ORAL | 0 refills | Status: DC

## 2016-04-29 MED ORDER — CIPROFLOXACIN HCL 500 MG PO TABS: 500.0000 mg | ORAL_TABLET | Freq: Two times a day (BID) | ORAL | 0 refills | Status: AC

## 2016-04-29 MED ORDER — CIPROFLOXACIN IN D5W 400 MG/200ML IV SOLN
400.0000 mg | Freq: Once | INTRAVENOUS | Status: AC
  Administered 2016-04-29: 400 mg via INTRAVENOUS
  Filled 2016-04-29: qty 200

## 2016-04-29 MED ORDER — METRONIDAZOLE IN NACL 5-0.79 MG/ML-% IV SOLN
500.0000 mg | Freq: Once | INTRAVENOUS | Status: AC
  Administered 2016-04-29: 500 mg via INTRAVENOUS
  Filled 2016-04-29: qty 100

## 2016-04-29 NOTE — ED Triage Notes (Signed)
Pt states started woke up at approx 0230 with generalized abdominal cramping. Pt states diarrhea at that time. Pt states was up "3 times during the night" with diarrhea, pt states took 2 immodium and pepto bismol. Pt also c/o rectal bleeding. This RN notified by EDT that after using the bathroom, dark red blood in the toilet, unsure if any clots. Pt states states started with constipation and "ended up with diarrhea".

## 2016-04-29 NOTE — ED Provider Notes (Signed)
[facility] Emergency Department Provider Note        Time seen: ----------------------------------------- 4:24 PM on 04/29/2016 -----------------------------------------    I have reviewed the triage vital signs and the nursing notes.   HISTORY  Chief Complaint Rectal Bleeding and Abdominal Pain    HPI Christy Mills is a 82 y.o. female who presents to the ER for abdominal cramping that started around 2:30 AM. Patient states she had diffuse cramping that persisted into the morning, had several episodes where she passed bright blood rectally. She has not had this problem before. She states she's been constipated recently. She did have diarrhea at 2:30 this morning before the bleeding started. She denies fevers chills or other complaints.   Past Medical History:  Diagnosis Date  . Anxiety   . Arthritis   . History of trigger finger   . Hyperlipidemia   . Hypertension   . Hypokalemia   . Osteoporosis     Patient Active Problem List   Diagnosis Date Noted  . Abnormal blood sugar 12/19/2015  . Acid reflux 12/19/2015  . Urinary frequency 12/19/2015  . Arthritis 09/12/2015  . Hypercholesterolemia 09/12/2015  . BP (high blood pressure) 09/12/2015  . Anxiety 05/26/2015  . Hyperlipemia 03/24/2015  . Osteoporosis 03/24/2015    Past Surgical History:  Procedure Laterality Date  . APPENDECTOMY  1978  . OOPHORECTOMY Right   . 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 TOTAL KNEE ARTHROPLASTY Right 10/2013    Allergies Review of patient's allergies indicates no known allergies.  Social History Social History  Substance Use Topics  . Smoking status: Former Smoker    Types: Cigarettes  . Smokeless tobacco: Never Used     Comment: quit in 1992  . Alcohol use No    Review of Systems Constitutional: Negative for fever. Cardiovascular: Negative for chest pain. Respiratory: Negative for shortness of  breath. Gastrointestinal: Positive for abdominal pain, diarrhea, constipation, rectal bleeding Genitourinary: Negative for dysuria. Musculoskeletal: Negative for back pain. Skin: Negative for rash. Neurological: Negative for headaches, focal weakness or numbness.  10-point ROS otherwise negative.  ____________________________________________   PHYSICAL EXAM:  VITAL SIGNS: ED Triage Vitals  Enc Vitals Group     BP 04/29/16 1459 (!) 146/79     Pulse Rate 04/29/16 1459 85     Resp 04/29/16 1459 18     Temp 04/29/16 1459 98.2 F (36.8 C)     Temp Source 04/29/16 1459 Oral     SpO2 04/29/16 1459 97 %     Weight 04/29/16 1459 200 lb (90.7 kg)     Height 04/29/16 1459 5\' 4"  (1.626 m)     Head Circumference --      Peak Flow --      Pain Score 04/29/16 1454 5     Pain Loc --      Pain Edu? --      Excl. in [place]? --    Constitutional: Alert and oriented. Well appearing and in no distress. Eyes: Conjunctivae are normal. PERRL. Normal extraocular movements. ENT   Head: Normocephalic and atraumatic.   Nose: No congestion/rhinnorhea.   Mouth/Throat: Mucous membranes are moist.   Neck: No stridor. Cardiovascular: Normal rate, regular rhythm. No murmurs, rubs, or gallops. Respiratory: Normal respiratory effort without tachypnea nor retractions. Breath sounds are clear and equal bilaterally. No wheezes/rales/rhonchi. Gastrointestinal: Soft and nontender. Normal bowel sounds Rectal: Large external hemorrhoids, one obviously bleeding. There is some rectal  dark blood as well. Musculoskeletal: Nontender with normal range of motion in all extremities. No lower extremity tenderness nor edema. Neurologic:  Normal speech and language. No gross focal neurologic deficits are appreciated.  Skin:  Skin is warm, dry and intact. No rash noted. Psychiatric: Mood and affect are normal. Speech and behavior are normal.  ____________________________________________  EKG: Interpreted by  me.Sinus rhythm rate of 79 bpm, normal PR interval, wide QRS, long QT, right bundle branch block.  ____________________________________________  ED COURSE:  Pertinent labs & imaging results that were available during my care of the patient were reviewed by me and considered in my medical decision making (see chart for details). Clinical Course  Patient presents to ER with abdominal pain and rectal bleeding. We will check basic labs and imaging if needed.  Procedures ____________________________________________   LABS (pertinent positives/negatives)  Labs Reviewed  COMPREHENSIVE METABOLIC PANEL - Abnormal; Notable for the following:       Result Value   Glucose, Bld 100 (*)    All other components within normal limits  CBC - Abnormal; Notable for the following:    WBC 12.8 (*)    All other components within normal limits  URINALYSIS COMPLETEWITH MICROSCOPIC ([facility] ONLY) - Abnormal; Notable for the following:    Color, Urine YELLOW (*)    APPearance CLEAR (*)    Squamous Epithelial / LPF 0-5 (*)    All other components within normal limits  LIPASE, BLOOD    RADIOLOGY Images were viewed by me  CT of the abdomen and pelvis with contrast IMPRESSION: 1. Acute inflammatory or infectious process involving the mid transverse colon, hepatic flexure and descending colon. Ischemic colitis is felt to be unlikely as discussed above. 2. Thickened endometrium for age (13 mm). Recommend followup pelvic ultrasound examination. 3. 15 mm right renal lesion is most likely a complex/hemorrhagic cyst. Recommend followup renal ultrasound examination in 6 months to document stability. ____________________________________________  FINAL ASSESSMENT AND PLAN  Abdominal pain, rectal bleeding, Colitis  Plan: Patient with labs and imaging as dictated above. Patient with a significant area of colitis involving the transverse colon, hepatic flexure and descending colon. Case was reviewed with Dr.  Burt Knack from general surgery. This was not felt to be ischemic related. This is likely infectious in origin. She was given Cipro and Flagyl here. She'll be discharged with Cipro and Flagyl to take at home, early GI follow-up as an outpatient. I did offer her admission but she has declined on multiple occasions.   Earleen Newport, MD   Note: This dictation was prepared with Dragon dictation. Any transcriptional errors that result from this process are unintentional    Earleen Newport, MD 04/29/16 [id number]

## 2016-04-29 NOTE — ED Notes (Signed)
Pt assisted up to bathroom.  

## 2016-04-29 NOTE — ED Notes (Signed)

## 2016-05-01 ENCOUNTER — Encounter: Payer: Self-pay | Admitting: Family Medicine

## 2016-05-18 DIAGNOSIS — M179 Osteoarthritis of knee, unspecified: Secondary | ICD-10-CM | POA: Diagnosis not present

## 2016-05-18 DIAGNOSIS — M25562 Pain in left knee: Secondary | ICD-10-CM | POA: Diagnosis not present

## 2016-05-19 ENCOUNTER — Other Ambulatory Visit: Payer: Self-pay | Admitting: Family Medicine

## 2016-05-21 NOTE — Telephone Encounter (Signed)
I believe you have been following pt since Dr. Venia Minks left. Had CPE 12/19/2015. Renaldo Fiddler, CMA

## 2016-05-22 DIAGNOSIS — R935 Abnormal findings on diagnostic imaging of other abdominal regions, including retroperitoneum: Secondary | ICD-10-CM | POA: Diagnosis not present

## 2016-05-22 DIAGNOSIS — R93429 Abnormal radiologic findings on diagnostic imaging of unspecified kidney: Secondary | ICD-10-CM | POA: Diagnosis not present

## 2016-05-23 ENCOUNTER — Other Ambulatory Visit: Payer: Self-pay | Admitting: Family Medicine

## 2016-05-23 DIAGNOSIS — N289 Disorder of kidney and ureter, unspecified: Secondary | ICD-10-CM

## 2016-05-24 ENCOUNTER — Other Ambulatory Visit: Payer: Self-pay | Admitting: Family Medicine

## 2016-05-24 DIAGNOSIS — F419 Anxiety disorder, unspecified: Secondary | ICD-10-CM

## 2016-05-25 NOTE — Telephone Encounter (Signed)
Called in. Emily Drozdowski, CMA  

## 2016-05-25 NOTE — Telephone Encounter (Signed)
Had wellness with Dr. Venia Minks on 12/19/2015. I believe you have been following pt since Dr. Venia Minks left. Renaldo Fiddler, CMA

## 2016-05-28 ENCOUNTER — Ambulatory Visit
Admission: RE | Admit: 2016-05-28 | Discharge: 2016-05-28 | Disposition: A | Discharge status: Home / Self Care | Payer: PPO | Source: Ambulatory Visit | Attending: Family Medicine | Admitting: Family Medicine

## 2016-05-28 DIAGNOSIS — N281 Cyst of kidney, acquired: Secondary | ICD-10-CM | POA: Diagnosis not present

## 2016-05-28 DIAGNOSIS — N289 Disorder of kidney and ureter, unspecified: Secondary | ICD-10-CM

## 2016-05-29 ENCOUNTER — Telehealth: Payer: Self-pay

## 2016-05-29 NOTE — Telephone Encounter (Signed)
Patient advised.KW 

## 2016-05-29 NOTE — Telephone Encounter (Signed)
Pt is returning call.  [id number]

## 2016-05-29 NOTE — Telephone Encounter (Signed)
-----   Message from Carmon Ginsberg, [place] sent at 05/28/2016  4:49 PM EDT ----- Ultrasound shows a simple kidney cyst which are benign and common. No further f/u necessary.

## 2016-05-29 NOTE — Telephone Encounter (Signed)
[id number] 

## 2016-05-31 DIAGNOSIS — M76892 Other specified enthesopathies of left lower limb, excluding foot: Secondary | ICD-10-CM | POA: Diagnosis not present

## 2016-05-31 DIAGNOSIS — Z96651 Presence of right artificial knee joint: Secondary | ICD-10-CM | POA: Diagnosis not present

## 2016-06-20 DIAGNOSIS — M25562 Pain in left knee: Secondary | ICD-10-CM | POA: Diagnosis not present

## 2016-06-25 DIAGNOSIS — R938 Abnormal findings on diagnostic imaging of other specified body structures: Secondary | ICD-10-CM | POA: Diagnosis not present

## 2016-06-26 DIAGNOSIS — M25562 Pain in left knee: Secondary | ICD-10-CM | POA: Diagnosis not present

## 2016-06-29 DIAGNOSIS — M25562 Pain in left knee: Secondary | ICD-10-CM | POA: Diagnosis not present

## 2016-07-03 DIAGNOSIS — M25562 Pain in left knee: Secondary | ICD-10-CM | POA: Diagnosis not present

## 2016-07-05 DIAGNOSIS — M25562 Pain in left knee: Secondary | ICD-10-CM | POA: Diagnosis not present

## 2016-07-06 DIAGNOSIS — R938 Abnormal findings on diagnostic imaging of other specified body structures: Secondary | ICD-10-CM | POA: Diagnosis not present

## 2016-07-09 ENCOUNTER — Other Ambulatory Visit: Payer: Self-pay | Admitting: Family Medicine

## 2016-07-09 DIAGNOSIS — F419 Anxiety disorder, unspecified: Secondary | ICD-10-CM

## 2016-07-31 ENCOUNTER — Encounter
Admission: RE | Admit: 2016-07-31 | Discharge: 2016-07-31 | Disposition: A | Discharge status: Home / Self Care | Payer: PPO | Source: Ambulatory Visit | Attending: Obstetrics & Gynecology | Admitting: Obstetrics & Gynecology

## 2016-07-31 DIAGNOSIS — E785 Hyperlipidemia, unspecified: Secondary | ICD-10-CM | POA: Insufficient documentation

## 2016-07-31 DIAGNOSIS — Z01812 Encounter for preprocedural laboratory examination: Secondary | ICD-10-CM | POA: Insufficient documentation

## 2016-07-31 DIAGNOSIS — K219 Gastro-esophageal reflux disease without esophagitis: Secondary | ICD-10-CM | POA: Diagnosis not present

## 2016-07-31 DIAGNOSIS — I10 Essential (primary) hypertension: Secondary | ICD-10-CM | POA: Diagnosis not present

## 2016-07-31 DIAGNOSIS — R7309 Other abnormal glucose: Secondary | ICD-10-CM | POA: Insufficient documentation

## 2016-07-31 DIAGNOSIS — R350 Frequency of micturition: Secondary | ICD-10-CM | POA: Insufficient documentation

## 2016-07-31 DIAGNOSIS — Z01818 Encounter for other preprocedural examination: Secondary | ICD-10-CM | POA: Diagnosis not present

## 2016-07-31 DIAGNOSIS — F419 Anxiety disorder, unspecified: Secondary | ICD-10-CM | POA: Insufficient documentation

## 2016-07-31 DIAGNOSIS — Z85828 Personal history of other malignant neoplasm of skin: Secondary | ICD-10-CM | POA: Insufficient documentation

## 2016-07-31 DIAGNOSIS — M810 Age-related osteoporosis without current pathological fracture: Secondary | ICD-10-CM | POA: Diagnosis not present

## 2016-07-31 DIAGNOSIS — E7800 Pure hypercholesterolemia, unspecified: Secondary | ICD-10-CM | POA: Diagnosis not present

## 2016-07-31 DIAGNOSIS — R938 Abnormal findings on diagnostic imaging of other specified body structures: Secondary | ICD-10-CM | POA: Diagnosis not present

## 2016-07-31 DIAGNOSIS — Z01810 Encounter for preprocedural cardiovascular examination: Secondary | ICD-10-CM | POA: Diagnosis not present

## 2016-07-31 DIAGNOSIS — I454 Nonspecific intraventricular block: Secondary | ICD-10-CM | POA: Diagnosis not present

## 2016-07-31 DIAGNOSIS — M1990 Unspecified osteoarthritis, unspecified site: Secondary | ICD-10-CM | POA: Diagnosis not present

## 2016-07-31 DIAGNOSIS — E876 Hypokalemia: Secondary | ICD-10-CM | POA: Insufficient documentation

## 2016-07-31 LAB — BASIC METABOLIC PANEL
Anion gap: 8 (ref 5–15)
BUN: 20 mg/dL (ref 6–20)
CALCIUM: 9 mg/dL (ref 8.9–10.3)
CO2: 25 mmol/L (ref 22–32)
CREATININE: 0.55 mg/dL (ref 0.44–1.00)
Chloride: 106 mmol/L (ref 101–111)
Glucose, Bld: 86 mg/dL (ref 65–99)
Potassium: 3.6 mmol/L (ref 3.5–5.1)
SODIUM: 139 mmol/L (ref 135–145)

## 2016-07-31 LAB — CBC
HCT: 44.3 % (ref 35.0–47.0)
Hemoglobin: 15.2 g/dL (ref 12.0–16.0)
MCH: 31.1 pg (ref 26.0–34.0)
MCHC: 34.2 g/dL (ref 32.0–36.0)
MCV: 90.9 fL (ref 80.0–100.0)
PLATELETS: 176 10*3/uL (ref 150–440)
RBC: 4.88 MIL/uL (ref 3.80–5.20)
RDW: 14.3 % (ref 11.5–14.5)
WBC: 6.5 10*3/uL (ref 3.6–11.0)

## 2016-07-31 LAB — TYPE AND SCREEN
ABO/RH(D): A POS
ANTIBODY SCREEN: NEGATIVE

## 2016-07-31 NOTE — Patient Instructions (Signed)
  Your procedure is scheduled [id number] Nov. 10 , 2017. Report to Same Day Surgery. To find out your arrival time please call [phone] between 1PM - 3PM on Thursday Nov. 9, 2017.  Remember: Instructions that are not followed completely may result in serious medical risk, up to and including death, or upon the discretion of your surgeon and anesthesiologist your surgery may need to be rescheduled.    _x___ 1. Do not eat food or drink liquids after midnight. No gum chewing or hard candies.     ____ 2. No Alcohol for 24 hours before or after surgery.   ____ 3. Bring all medications with you on the day of surgery if instructed.    __x__ 4. Notify your doctor if there is any change in your medical condition     (cold, fever, infections).    _____ 5. No smoking 24 hours prior to surgery.     Do not wear jewelry, make-up, hairpins, clips or nail polish.  Do not wear lotions, powders, or perfumes.   Do not shave 48 hours prior to surgery. Men may shave face and neck.  Do not bring valuables to the hospital.    [facility] is not responsible for any belongings or valuables.               Contacts, dentures or bridgework may not be worn into surgery.  Leave your suitcase in the car. After surgery it may be brought to your room.  For patients admitted to the hospital, discharge time is determined by your treatment team.   Patients discharged the day of surgery will not be allowed to drive home.    Please read over the following fact sheets that you were given:   [facility] Preparing for Surgery  __x__ Take these medicines the morning of surgery with A SIP OF WATER:    1. amLODipine (NORVASC)  2. lisinopril (PRINIVIL,ZESTRIL)  3. omeprazole (PRILOSEC)    ____ Fleet Enema (as directed)   ____ Use CHG Soap as directed on instruction sheet  ____ Use inhalers on the day of surgery and bring to hospital day of surgery  ____ Stop metformin 2 days prior to surgery    ____ Take 1/2  of usual insulin dose the night before surgery and none on the morning of surgery.   ____ Stop Coumadin/Plavix/aspirin on does not apply.  _x___ Stop Anti-inflammatories such as Advil, Aleve, Ibuprofen, Motrin, Naproxen, Naprosyn, Goodies powders or aspirin products. OK to take Tylenol.   __x__ Stop supplementsMisc Natural Products (OSTEO BI-FLEX ADV DOUBLE ST PO)  until after surgery.    ____ Bring C-Pap to the hospital.

## 2016-08-02 NOTE — H&P (Signed)
Chief Complaint:   No chief complaint on file. ---- Thickened endometrium incidentally found on CT scan   HPI:  Christy Mills is a 82 y.o. [id number] here for female here for thickened endometrium .    Location:  uterus  Quality:  n/a  Severity:  No pain  Duration:  Noted since 03/2016  Timing:  n/a  Modifying factors: n/a  Associated signs and symptoms:  None.  Denies vaginal bleeding, pain, cramping, intentional weight loss (although did have colitis and lost some weight during that episode)   Context:   In 03/2016 patient was seen in the ED for diarrhea and bright red blood per rectum, and a CT was done which incidentally showed a thickened endometrium.  She refused hospital admission and was discharged with PO abx.  She has not had any episodes of vaginal bleeding that she knows of.  Treatments and evaluations: as above.  No LMP recorded. Patient is postmenopausal.   Menarche: 14 Last pap smear: years ago, never abnormal History of abnormal pap smears: no Sexually active: no HRT use: briefly, years ago History of sexually transmitted infections: no Colonoscopy: 2014, polyps Mammogram: 2017 benign    Problem List  Date Reviewed: 2016-07-16         Codes Priority Class Noted - Resolved   Thickened endometrium ICD-10-CM: R93.8 ICD-9-CM: 793.5   Unknown - Present   Anxiety disorder, unspecified ICD-10-CM: F41.9 ICD-9-CM: 300.00   05/26/2015 - Present   Status post total right knee replacement ICD-10-CM: [id number] ICD-9-CM: V43.65   04/17/2015 - Present   Hyperlipidemia, unspecified ICD-10-CM: E78.5 ICD-9-CM: 272.4   03/24/2015 - Present   Essential (primary) hypertension ICD-10-CM: I10 ICD-9-CM: 401.9   03/24/2015 - Present   Age-related osteoporosis without current pathological fracture ICD-10-CM: M81.0 ICD-9-CM: 733.01   03/24/2015 - Present       Past Medical History:  has a past medical history of Arthritis; Gout, joint; Hearing impairment; Hyperlipidemia,  unspecified; Hypertension; Nephrolithiasis; Osteoporosis; and Varicella.  Past Surgical History:  has a past surgical history that includes Appendectomy; Excision of benign parotid gland tumor (2012); MOHS skin cancer on nose surgery (11/2012); Fracture surgery (2002); Right total knee arthroplasty (10/21/2013); and Colonoscopy (2014). Family History: family history includes Breast cancer in her mother; Cancer in her brother and father; Colon polyps in her father; Diabetes mellitus in her father; Hypertension in her father and mother. Social History:  reports that she quit smoking about 25 years ago. She quit after 30.00 years of use. She has never used smokeless tobacco. She reports that she does not drink alcohol or use drugs. OB/GYN History:  OB History    Gravida Para Term Preterm AB Living   3 2 2   1 2    SAB TAB Ectopic Molar Multiple Live Births   1                Allergies: has No Known Allergies. Medications:  Current Outpatient Prescriptions:  .  acetaminophen (TYLENOL) 500 MG tablet, 500 mg. Take 1-2 tablets by mouth as needed for pain., Disp: , Rfl:  .  amLODIPine (NORVASC) 5 MG tablet, Take 5 mg by mouth once daily., Disp: , Rfl:  .  aspirin 81 MG EC tablet, Take 81 mg by mouth once daily., Disp: , Rfl:  .  CALCIUM CARBONATE/VITAMIN D3 (OSTEO-PORETICAL ORAL), Take 1 tablet by mouth 2 (two) times daily., Disp: , Rfl:  .  diphenhydrAMINE (BENADRYL ALLERGY) 25 mg tablet, 25 mg. Take 1/2 tablet by mouth  every hour as needed., Disp: , Rfl:  .  GLUCOSAM/GLUC SU/AC-ALP-D-GLUC (GLUCOSAMINE COMPLEX ORAL), Take by mouth., Disp: , Rfl:  .  lisinopril (PRINIVIL,ZESTRIL) 40 MG tablet, Take 40 mg by mouth once daily., Disp: , Rfl:  .  LORazepam (ATIVAN) 1 MG tablet, Take 1 mg by mouth nightly as needed for Anxiety., Disp: , Rfl:  .  multivitamin tablet, Take 1 tablet by mouth once daily., Disp: , Rfl:  .  naproxen sodium (ALEVE, ANAPROX) 220 MG tablet, Take 220 mg by mouth 2 (two) times daily  with meals., Disp: , Rfl:  .  omeprazole (PRILOSEC) 20 MG DR capsule, Take 20 mg by mouth once daily., Disp: , Rfl:  .  raloxifene (EVISTA) 60 mg tablet, Take 60 mg by mouth once daily., Disp: , Rfl:  .  simvastatin (ZOCOR) 20 MG tablet, Take 20 mg by mouth nightly., Disp: , Rfl:  .  traMADol (ULTRAM) 50 mg tablet, Take 1-2 tablets (50-100 mg total) by mouth every 6 (six) hours as needed for Pain., Disp: 50 tablet, Rfl: 0  Review of Systems: General:   No fatigue or weight loss Eyes:   No vision changes Ears:   No hearing difficulty Respiratory:                No cough or shortness of breath Pulmonary:   No asthma or shortness of breath Cardiovascular:        No chest pain, palpitations, dyspnea on exertion Gastrointestinal:          No abdominal bloating, chronic diarrhea, constipations, masses, pain or hematochezia Genitourinary:  No hematuria, dysuria, abnormal vaginal discharge, pelvic pain, Menometrorrhagia Lymphatic:  No swollen lymph nodes Musculoskeletal: No muscle weakness Neurologic:  No extremity weakness, syncope, seizure disorder Psychiatric:  No history of depression, delusions or suicidal/homicidal ideation    Exam:   Vitals:   07/31/16 1134  BP: 141/77  Pulse: 75    Body mass index is 34.36 kg/m.  WDWN white female in NAD   CV: RRR no MRG Lungs: normal respiratory effort ABD: s/nd/nt normal bowel sounds Pelvic: tanner stage 5 ,   External genitalia: vulva /labia no lesions  Urethra: no prolapse  Vagina: normal physiologic d/c  Cervix: no lesions, no cervical motion tenderness    Uterus: normal size shape and contour, non-tender  Adnexa: no mass,  non-tender    Rectovaginal: no mass heme negative  Ultrasound: Uterus: 6x5x5cm with 2.7cm anterior vascular fibroid EE: 4mm, heterogeneous LO: 2x2x1.5cm  RO surgically absent. No FF in culdesac  Impression:   Heterogeneously Thickened Endometrium in a post menopausal woman    Plan:   - Due to  thickness (1.2cm) and heterogenicity of the endometrium she was counseled on the potential for cancer - we cannot rule it out. - This warrants evaluation, and due to the thickness of 4x normal for post menopausal woman, I recommend a D&C with hysteroscopy to evacuate the endometrial cavity, which is both diagnostic and therapeutic.   - The patient and I discussed the technical aspects of the procedure including the potential for risks and complications.These include but are not limited to the risk of infection requiring post-operative antibiotics or further procedures.We talked about the risk of injury to adjacent organs including bladder, bowel, ureter, blood vessels or nerves.We talked about the possibleneed for blood transfusion.We talked aboutpostop complications such asthromboembolic or cardiopulmonary complications.All of her questions were answered. Her preoperative exam was completed andthe appropriate consents were signed. She is scheduled to undergo  this procedure on 08/10/16

## 2016-08-10 ENCOUNTER — Encounter: Payer: Self-pay | Admitting: *Deleted

## 2016-08-10 ENCOUNTER — Ambulatory Visit: Payer: PPO | Admitting: Anesthesiology

## 2016-08-10 ENCOUNTER — Encounter
Admission: RE | Disposition: A | Discharge status: Home / Self Care | Payer: Self-pay | Source: Ambulatory Visit | Attending: Obstetrics & Gynecology

## 2016-08-10 ENCOUNTER — Ambulatory Visit
Admission: RE | Admit: 2016-08-10 | Discharge: 2016-08-10 | Disposition: A | Discharge status: Home / Self Care | Payer: PPO | Source: Ambulatory Visit | Attending: Obstetrics & Gynecology | Admitting: Obstetrics & Gynecology

## 2016-08-10 DIAGNOSIS — N950 Postmenopausal bleeding: Secondary | ICD-10-CM | POA: Diagnosis not present

## 2016-08-10 DIAGNOSIS — Z96651 Presence of right artificial knee joint: Secondary | ICD-10-CM | POA: Diagnosis not present

## 2016-08-10 DIAGNOSIS — I4510 Unspecified right bundle-branch block: Secondary | ICD-10-CM | POA: Insufficient documentation

## 2016-08-10 DIAGNOSIS — M1990 Unspecified osteoarthritis, unspecified site: Secondary | ICD-10-CM | POA: Diagnosis not present

## 2016-08-10 DIAGNOSIS — Z9842 Cataract extraction status, left eye: Secondary | ICD-10-CM | POA: Diagnosis not present

## 2016-08-10 DIAGNOSIS — K219 Gastro-esophageal reflux disease without esophagitis: Secondary | ICD-10-CM | POA: Diagnosis not present

## 2016-08-10 DIAGNOSIS — M2629 Other anomalies of dental arch relationship: Secondary | ICD-10-CM | POA: Insufficient documentation

## 2016-08-10 DIAGNOSIS — Z90721 Acquired absence of ovaries, unilateral: Secondary | ICD-10-CM | POA: Insufficient documentation

## 2016-08-10 DIAGNOSIS — E785 Hyperlipidemia, unspecified: Secondary | ICD-10-CM | POA: Diagnosis not present

## 2016-08-10 DIAGNOSIS — N840 Polyp of corpus uteri: Secondary | ICD-10-CM | POA: Insufficient documentation

## 2016-08-10 DIAGNOSIS — I10 Essential (primary) hypertension: Secondary | ICD-10-CM | POA: Insufficient documentation

## 2016-08-10 DIAGNOSIS — Z85828 Personal history of other malignant neoplasm of skin: Secondary | ICD-10-CM | POA: Diagnosis not present

## 2016-08-10 DIAGNOSIS — Z9841 Cataract extraction status, right eye: Secondary | ICD-10-CM | POA: Diagnosis not present

## 2016-08-10 DIAGNOSIS — M810 Age-related osteoporosis without current pathological fracture: Secondary | ICD-10-CM | POA: Insufficient documentation

## 2016-08-10 DIAGNOSIS — F419 Anxiety disorder, unspecified: Secondary | ICD-10-CM | POA: Diagnosis not present

## 2016-08-10 HISTORY — PX: HYSTEROSCOPY WITH D & C: SHX1775

## 2016-08-10 LAB — ABO/RH: ABO/RH(D): A POS

## 2016-08-10 SURGERY — DILATATION AND CURETTAGE /HYSTEROSCOPY
Anesthesia: General

## 2016-08-10 MED ORDER — LIDOCAINE HCL (CARDIAC) 20 MG/ML IV SOLN
INTRAVENOUS | Status: DC | PRN
  Administered 2016-08-10: 40 mg via INTRAVENOUS

## 2016-08-10 MED ORDER — PROPOFOL 10 MG/ML IV BOLUS
INTRAVENOUS | Status: DC | PRN
  Administered 2016-08-10: 100 mg via INTRAVENOUS

## 2016-08-10 MED ORDER — ACETAMINOPHEN 650 MG RE SUPP
650.0000 mg | RECTAL | Status: DC | PRN
  Filled 2016-08-10: qty 1

## 2016-08-10 MED ORDER — ACETAMINOPHEN 325 MG PO TABS: 650.0000 mg | ORAL_TABLET | ORAL | Status: DC | PRN

## 2016-08-10 MED ORDER — FENTANYL CITRATE (PF) 100 MCG/2ML IJ SOLN
25.0000 ug | INTRAMUSCULAR | Status: DC | PRN
  Administered 2016-08-10 (×2): 25 ug via INTRAVENOUS

## 2016-08-10 MED ORDER — FENTANYL CITRATE (PF) 100 MCG/2ML IJ SOLN
INTRAMUSCULAR | Status: AC
  Administered 2016-08-10: 25 ug via INTRAVENOUS
  Filled 2016-08-10: qty 2

## 2016-08-10 MED ORDER — FENTANYL CITRATE (PF) 100 MCG/2ML IJ SOLN
INTRAMUSCULAR | Status: DC | PRN
  Administered 2016-08-10: 25 ug via INTRAVENOUS
  Administered 2016-08-10: 50 ug via INTRAVENOUS

## 2016-08-10 MED ORDER — MORPHINE SULFATE (PF) 4 MG/ML IV SOLN: 1.0000 mg | INTRAVENOUS | Status: DC | PRN

## 2016-08-10 MED ORDER — GLYCOPYRROLATE 0.2 MG/ML IJ SOLN
INTRAMUSCULAR | Status: DC | PRN
  Administered 2016-08-10: 0.2 mg via INTRAVENOUS

## 2016-08-10 MED ORDER — KETOROLAC TROMETHAMINE 30 MG/ML IJ SOLN: 30.0000 mg | Freq: Four times a day (QID) | INTRAMUSCULAR | Status: DC

## 2016-08-10 MED ORDER — DEXAMETHASONE SODIUM PHOSPHATE 10 MG/ML IJ SOLN
INTRAMUSCULAR | Status: DC | PRN
  Administered 2016-08-10: 6 mg via INTRAVENOUS

## 2016-08-10 MED ORDER — ONDANSETRON HCL 4 MG/2ML IJ SOLN: 4.0000 mg | Freq: Once | INTRAMUSCULAR | Status: DC | PRN

## 2016-08-10 MED ORDER — ONDANSETRON HCL 4 MG/2ML IJ SOLN
INTRAMUSCULAR | Status: DC | PRN
  Administered 2016-08-10: 4 mg via INTRAVENOUS

## 2016-08-10 MED ORDER — LACTATED RINGERS IV SOLN
INTRAVENOUS | Status: DC
  Administered 2016-08-10 (×2): via INTRAVENOUS

## 2016-08-10 SURGICAL SUPPLY — 19 items
BAG INFUSER PRESSURE 100CC (MISCELLANEOUS) ×3 IMPLANT
ELECT REM PT RETURN 9FT ADLT (ELECTROSURGICAL) ×3
ELECTRODE REM PT RTRN 9FT ADLT (ELECTROSURGICAL) ×1 IMPLANT
GLOVE PI ORTHOPRO 6.5 (GLOVE) ×2
GLOVE PI ORTHOPRO STRL 6.5 (GLOVE) ×1 IMPLANT
GLOVE SURG SYN 6.5 ES PF (GLOVE) ×3 IMPLANT
GLOVE SURG SYN 6.5 PF PI (GLOVE) ×1 IMPLANT
GOWN STRL REUS W/ TWL LRG LVL3 (GOWN DISPOSABLE) ×2 IMPLANT
GOWN STRL REUS W/TWL LRG LVL3 (GOWN DISPOSABLE) ×6
IV LACTATED RINGERS 1000ML (IV SOLUTION) ×3 IMPLANT
KIT RM TURNOVER CYSTO AR (KITS) ×3 IMPLANT
NDL SPNL 22GX3.5 QUINCKE BK (NEEDLE) ×1 IMPLANT
NEEDLE SPNL 22GX3.5 QUINCKE BK (NEEDLE) ×3 IMPLANT
PACK DNC HYST (MISCELLANEOUS) ×3 IMPLANT
PAD OB MATERNITY 4.3X12.25 (PERSONAL CARE ITEMS) ×3 IMPLANT
PAD PREP 24X41 OB/GYN DISP (PERSONAL CARE ITEMS) ×3 IMPLANT
SYRINGE 10CC LL (SYRINGE) ×3 IMPLANT
TUBING CONNECTING 10 (TUBING) ×2 IMPLANT
TUBING CONNECTING 10' (TUBING) ×1

## 2016-08-10 NOTE — Transfer of Care (Signed)
Immediate Anesthesia Transfer of Care Note  Patient: Christy Mills  Procedure(s) Performed: Procedure(s): DILATATION AND CURETTAGE /HYSTEROSCOPY (N/A)  Patient Location: PACU  Anesthesia Type:General  Level of Consciousness: awake  Airway & Oxygen Therapy: Patient Spontanous Breathing and Patient connected to face mask oxygen  Post-op Assessment: Report given to RN and Post -op Vital signs reviewed and stable  Post vital signs: Reviewed and stable  Last Vitals:  Vitals:   08/10/16 0654  BP: (!) 142/66  Pulse: 68  Resp: 16  Temp: 36.2 C    Last Pain:  Vitals:   08/10/16 0654  TempSrc: Tympanic         Complications: No apparent anesthesia complications

## 2016-08-10 NOTE — Discharge Instructions (Addendum)
Hysteroscopy, Care After Refer to this sheet in the next few weeks. These instructions provide you with information on caring for yourself after your procedure. Your health care provider may also give you more specific instructions. Your treatment has been planned according to current medical practices, but problems sometimes occur. Call your health care provider if you have any problems or questions after your procedure.  WHAT TO EXPECT AFTER THE PROCEDURE After your procedure, it is typical to have the following:  You may have some cramping. This normally lasts for a couple days.  You may have bleeding. This can vary from light spotting for a few days to menstrual-like bleeding for 3-7 days. HOME CARE INSTRUCTIONS  Rest for the first 1-2 days after the procedure.  Only take over-the-counter or prescription medicines as directed by your health care provider. Do not take aspirin. It can increase the chances of bleeding.  Take showers instead of baths for 2 weeks or as directed by your health care provider.  Do not drive for 24 hours or as directed.  Do not drink alcohol while taking pain medicine.  Do not use tampons, douche, or have sexual intercourse for 2 weeks or until your health care provider says it is okay.  Take your temperature twice a day for 4-5 days. Write it down each time.  Follow your health care provider's advice about diet, exercise, and lifting.  If you develop constipation, you may:  Take a mild laxative if your health care provider approves.  Add bran foods to your diet.  Drink enough fluids to keep your urine clear or pale yellow.  Try to have someone with you or available to you for the first 24-48 hours, especially if you were given a general anesthetic.  Follow up with your health care provider as directed. SEEK MEDICAL CARE IF:  You feel dizzy or lightheaded.  You feel sick to your stomach (nauseous).  You have abnormal vaginal discharge.  You  have a rash.  You have pain that is not controlled with medicine. SEEK IMMEDIATE MEDICAL CARE IF:  You have bleeding that is heavier than a normal menstrual period.  You have a fever.  You have increasing cramps or pain, not controlled with medicine.  You have new belly (abdominal) pain.  You pass out.  You have pain in the tops of your shoulders (shoulder strap areas).  You have shortness of breath.   This information is not intended to replace advice given to you by your health care provider. Make sure you discuss any questions you have with your health care provider.    General Anesthesia, Adult, Care After Refer to this sheet in the next few weeks. These instructions provide you with information on caring for yourself after your procedure. Your health care provider may also give you more specific instructions. Your treatment has been planned according to current medical practices, but problems sometimes occur. Call your health care provider if you have any problems or questions after your procedure. WHAT TO EXPECT AFTER THE PROCEDURE After the procedure, it is typical to experience:  Sleepiness.  Nausea and vomiting. HOME CARE INSTRUCTIONS  For the first 24 hours after general anesthesia:  Have a responsible person with you.  Do not drive a car. If you are alone, do not take public transportation.  Do not drink alcohol.  Do not take medicine that has not been prescribed by your health care provider.  Do not sign important papers or make important decisions.  You may resume a normal diet and activities as directed by your health care provider.  Change bandages (dressings) as directed.  If you have questions or problems that seem related to general anesthesia, call the hospital and ask for the anesthetist or anesthesiologist on call. SEEK MEDICAL CARE IF:  You have nausea and vomiting that continue the day after anesthesia.  You develop a rash. SEEK IMMEDIATE  MEDICAL CARE IF:   You have difficulty breathing.  You have chest pain.  You have any allergic problems.   This information is not intended to replace advice given to you by your health care provider. Make sure you discuss any questions you have with your health care provider.

## 2016-08-10 NOTE — Anesthesia Procedure Notes (Signed)
Procedure Name: LMA Insertion Date/Time: 08/10/2016 7:40 AM Performed by: Allean Found Pre-anesthesia Checklist: Patient identified, Emergency Drugs available, Suction available, Patient being monitored and Timeout performed Patient Re-evaluated:Patient Re-evaluated prior to inductionOxygen Delivery Method: Circle system utilized Preoxygenation: Pre-oxygenation with 100% oxygen Intubation Type: IV induction Ventilation: Mask ventilation without difficulty LMA: LMA inserted LMA Size: 4.0 Number of attempts: 1 Tube secured with: Tape Dental Injury: Teeth and Oropharynx as per pre-operative assessment

## 2016-08-10 NOTE — H&P (Signed)
H&P Update  PLEASE SEE PREVIOUS H&P  Pt was last seen in my office, and complete history and physical performed.  The surgical history has been reviewed and remains accurate without interval change. The patient was re-examined and patient's physiologic condition has not changed significantly in the last 30 days.  No new pharmacological allergies or types of therapy has been initiated.  No Known Allergies  Past Medical History:  Diagnosis Date  . Anxiety 1980   difficulty sleeping after mother died.  . Arthritis   . Cancer ([place])    skin resected from nose in 2014.  Marland Kitchen History of trigger finger   . Hyperlipidemia   . Hypertension   . Osteoporosis    Past Surgical History:  Procedure Laterality Date  . APPENDECTOMY  1978  . CATARACT EXTRACTION W/ INTRAOCULAR LENS IMPLANT Bilateral 2004   one eye than the other a month laster  . JOINT REPLACEMENT Right 2015  . MOHS SURGERY  2014   nose  . OOPHORECTOMY Right   . 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 TOTAL KNEE ARTHROPLASTY Right 10/2013    BP (!) 142/66   Pulse 68   Temp 97.1 F (36.2 C) (Tympanic)   Resp 16   Ht 5\' 4"  (1.626 m)   Wt 90.7 kg (200 lb)   SpO2 97%   BMI 34.33 kg/m   NAD RRR no murmurs CTAB, no wheezing, resps unlabored +BS, soft, NTTP No c/c/e Pelvic exam deferred  The above history was confirmed with the patient. The condition still exists that makes this procedure necessary. Surgical plan includes DILATION AND CURETTAGE, HYSTEROSCOPY as confirmed on the consent. The treatment plan remains the same, without new options for care.  The patient understands the potential benefits and risks and the consents have been signed and placed on the chart.     Larey Days, MD Attending Obstetrician Gynecologist [facility], Department of [facility]

## 2016-08-10 NOTE — Op Note (Signed)
Operative Report Hysteroscopy, Dilation and Curettage 08/10/2016  Patient:  Christy Mills  82 y.o. female Preoperative diagnosis:  PMB Postoperative diagnosis:  PMB  PROCEDURE:  Procedure(s): DILATATION AND CURETTAGE /HYSTEROSCOPY (N/A) Surgeon:  Surgeon(s) and Role:    * Chelsea Loletha Grayer Ward, MD - Primary Anesthesia:  LMA I/O: Total I/O In: 700 [I.V.:700] Out: 5 [Blood:5] Specimens:  Endometrial curettings Complications: None Apparent Disposition:  VS stable to PACU  Findings: Uterus, mobile, normal size, sounding to 8 cm; normal cervix, vagina, perineum. Single large polyp with anterior base.  Indication for procedure/Consents: 82 y.o. No obstetric history on file.  here for scheduled surgery for the aforementioned diagnoses.  She was incidentally found to have a thickened endometrium on CT scan, with heterogenicity on ultrasound.  Endoemetrial Echo was >1.2cm.  Risks of surgery were discussed with the patient including but not limited to: bleeding which may require transfusion; infection which may require antibiotics; injury to uterus or surrounding organs; intrauterine scarring which may impair future fertility; need for additional procedures including laparotomy or laparoscopy; and other postoperative/anesthesia complications. Written informed consent was obtained.    Procedure Details:   The patient was then taken to the operating room where anesthesia was administered and was found to be adequate.  After a formal and adequate timeout was performed, she was placed in the dorsal lithotomy position and examined with the above findings. She was then prepped and draped in the sterile manner.  A speculum was then placed in the patient's vagina and a single tooth tenaculum was applied to the anterior lip of the cervix.   The uterus was sounded to 8cm. Her cervix was serially dilated to accommodate the myoscope, with findings as above. A polyps forceps was inserted and polypectomy performed.  A  sharp curettage was then performed until there was a gritty texture in all four quadrants. The specimens were handed off to nursing.  The camera was reinserted and confirmed the uterus had been evacuated. The tenaculum was removed from the anterior lip of the cervix and the vaginal speculum was removed after noting good hemostasis. The patient tolerated the procedure well and was taken to the recovery area awake, extubated and in stable condition.  The patient will be discharged to home as per PACU criteria.  Routine postoperative instructions given. She will follow up in the clinic in two to four weeks for postoperative evaluation.  Larey Days, MD [facility] Attending Gynecologist

## 2016-08-10 NOTE — Anesthesia Postprocedure Evaluation (Signed)
Anesthesia Post Note  Patient: Christy Mills  Procedure(s) Performed: Procedure(s) (LRB): DILATATION AND CURETTAGE /HYSTEROSCOPY (N/A)  Patient location during evaluation: PACU Anesthesia Type: General Level of consciousness: awake and alert Pain management: pain level controlled Vital Signs Assessment: post-procedure vital signs reviewed and stable Respiratory status: spontaneous breathing, nonlabored ventilation, respiratory function stable and patient connected to nasal cannula oxygen Cardiovascular status: blood pressure returned to baseline and stable Postop Assessment: no signs of nausea or vomiting Anesthetic complications: no    Last Vitals:  Vitals:   08/10/16 0918 08/10/16 0941  BP: (!) 142/79 126/87  Pulse: 73 73  Resp: 14 13  Temp: 36.1 C     Last Pain:  Vitals:   08/10/16 0918  TempSrc: Temporal  PainSc:                  Dennise Raabe S

## 2016-08-10 NOTE — Anesthesia Preprocedure Evaluation (Addendum)
Anesthesia Evaluation  Patient identified by MRN, date of birth, ID band Patient awake    Reviewed: Allergy & Precautions, NPO status , Patient's Chart, lab work & pertinent test results, reviewed documented beta blocker date and time   Airway Mallampati: III  TM Distance: >3 FB     Dental  (+) Chipped   Pulmonary former smoker,           Cardiovascular hypertension, Pt. 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 Overbite. No cardiac symptoms.  Reproductive/Obstetrics                           Anesthesia Physical Anesthesia Plan  ASA: III  Anesthesia Plan: General   Post-op Pain Management:    Induction: Intravenous  Airway Management Planned: LMA  Additional Equipment:   Intra-op Plan:   Post-operative Plan:   Informed Consent: I have reviewed the patients History and Physical, chart, labs and discussed the procedure including the risks, benefits and alternatives for the proposed anesthesia with the patient or authorized representative who has indicated his/her understanding and acceptance.     Plan Discussed with: CRNA  Anesthesia Plan Comments:         Anesthesia Quick Evaluation

## 2016-08-13 LAB — SURGICAL PATHOLOGY

## 2016-08-30 DIAGNOSIS — N840 Polyp of corpus uteri: Secondary | ICD-10-CM | POA: Diagnosis not present

## 2016-10-05 ENCOUNTER — Other Ambulatory Visit: Payer: Self-pay | Admitting: Family Medicine

## 2016-10-05 DIAGNOSIS — F419 Anxiety disorder, unspecified: Secondary | ICD-10-CM

## 2016-10-05 NOTE — Telephone Encounter (Signed)
Please see refill notice.

## 2016-10-05 NOTE — Telephone Encounter (Signed)
Called in to [place]. Christy Mills, CMA

## 2016-10-09 ENCOUNTER — Other Ambulatory Visit: Payer: Self-pay | Admitting: Family Medicine

## 2016-10-09 DIAGNOSIS — E785 Hyperlipidemia, unspecified: Secondary | ICD-10-CM

## 2016-10-17 ENCOUNTER — Ambulatory Visit: Payer: PPO | Admitting: Family Medicine

## 2016-10-23 ENCOUNTER — Other Ambulatory Visit: Payer: Self-pay | Admitting: Family Medicine

## 2016-10-23 DIAGNOSIS — I10 Essential (primary) hypertension: Secondary | ICD-10-CM

## 2016-11-19 ENCOUNTER — Telehealth: Payer: Self-pay

## 2016-11-19 NOTE — Telephone Encounter (Signed)
Patient called saying she is supposed to be a patient of Dr Rosanna Randy from Dr Venia Minks.  Would like to make an appointment to be seen because she is having foot swelling and wants to see what you say about it before she sees a specialist.   Are you going to be taking her as a patient? Call back number is [phone]

## 2016-11-20 NOTE — Telephone Encounter (Signed)
LMTCB ED 

## 2016-11-20 NOTE — Telephone Encounter (Signed)
Tawanna Sat or Adrianna for now--to f/u with Dr Geri Seminole when she starts in August if she wishes.

## 2016-11-20 NOTE — Telephone Encounter (Signed)
Dr. Darnell Level, FYI-Patient was given the information regarding scheduling an appointment one of the other providers..  She declined to be set up with any other provider in the office.  She stated that she does not know any of them and she will find a provider somewhere else.  She was offered an appointment several times and still declined.  ED

## 2016-11-21 NOTE — Telephone Encounter (Signed)
thanks

## 2016-11-26 ENCOUNTER — Encounter: Payer: Self-pay | Admitting: Physician Assistant

## 2016-11-26 ENCOUNTER — Ambulatory Visit (INDEPENDENT_AMBULATORY_CARE_PROVIDER_SITE_OTHER): Payer: PPO | Admitting: Physician Assistant

## 2016-11-26 ENCOUNTER — Telehealth: Payer: Self-pay

## 2016-11-26 VITALS — BP 140/60 | HR 76 | Temp 98.2°F | Resp 16 | Wt 207.4 lb

## 2016-11-26 DIAGNOSIS — M7989 Other specified soft tissue disorders: Secondary | ICD-10-CM

## 2016-11-26 DIAGNOSIS — E7800 Pure hypercholesterolemia, unspecified: Secondary | ICD-10-CM

## 2016-11-26 DIAGNOSIS — K219 Gastro-esophageal reflux disease without esophagitis: Secondary | ICD-10-CM

## 2016-11-26 DIAGNOSIS — I10 Essential (primary) hypertension: Secondary | ICD-10-CM

## 2016-11-26 DIAGNOSIS — R7309 Other abnormal glucose: Secondary | ICD-10-CM | POA: Diagnosis not present

## 2016-11-26 DIAGNOSIS — F419 Anxiety disorder, unspecified: Secondary | ICD-10-CM

## 2016-11-26 MED ORDER — OMEPRAZOLE 20 MG PO CPDR: 20.0000 mg | DELAYED_RELEASE_CAPSULE | Freq: Every day | ORAL | 1 refills | Status: DC

## 2016-11-26 MED ORDER — POTASSIUM CHLORIDE ER 10 MEQ PO TBCR: 10.0000 meq | EXTENDED_RELEASE_TABLET | Freq: Every day | ORAL | 1 refills | Status: DC

## 2016-11-26 MED ORDER — AMLODIPINE BESYLATE 5 MG PO TABS: 5.0000 mg | ORAL_TABLET | Freq: Every day | ORAL | 1 refills | Status: DC

## 2016-11-26 MED ORDER — LISINOPRIL 40 MG PO TABS: 40.0000 mg | ORAL_TABLET | Freq: Every day | ORAL | 1 refills | Status: DC

## 2016-11-26 MED ORDER — LORAZEPAM 1 MG PO TABS: 1.0000 mg | ORAL_TABLET | Freq: Every day | ORAL | 1 refills | Status: DC

## 2016-11-26 MED ORDER — FUROSEMIDE 20 MG PO TABS: 20.0000 mg | ORAL_TABLET | Freq: Every day | ORAL | 1 refills | Status: DC | PRN

## 2016-11-26 NOTE — Progress Notes (Signed)
Patient: Christy Mills Female    DOB: 29-Jun-1934   83 y.o.   MRN: [id number] Visit Date: 11/26/2016  Today's Provider: Mar Daring, PA-C   Chief Complaint  Patient presents with  . Follow-up    HTN, Hypercholesterolemia,Elevated A1C,GERD   Subjective:    HPI  Hypertension, follow-up:  BP Readings from Last 3 Encounters:  11/26/16 140/60  08/10/16 126/87  07/31/16 (!) 138/93    She was last seen for hypertension 11 months ago.  BP at that visit was 120/64. Management since that visit includes none. She reports excellent compliance with treatment. She is not having side effects.  She is not exercising. She is adherent to low salt diet.   She is experiencing lower extremity edema.  Patient denies chest pain, chest pressure/discomfort, fatigue, irregular heart beat and palpitations.   Cardiovascular risk factors include advanced age (older than 79 for men, 59 for women), dyslipidemia and hypertension.  Use of agents associated with hypertension: none.     Weight trend: stable Wt Readings from Last 3 Encounters:  11/26/16 207 lb 6.4 oz (94.1 kg)  08/10/16 200 lb (90.7 kg)  07/31/16 200 lb (90.7 kg)    Current diet: in general, a "healthy" diet    ------------------------------------------------------------------------  Lipid/Cholesterol, Follow-up:   Last seen for this11 months ago.  Management changes since that visit include none. . Last Lipid Panel:    Component Value Date/Time   CHOL 151 12/21/2015 0941   TRIG 125 12/21/2015 0941   HDL 41 12/21/2015 0941   LDLCALC 85 12/21/2015 0941    Risk factors for vascular disease include diabetes mellitus, hypercholesterolemia and hypertension  She reports excellent compliance with treatment. She is not having side effects.    Wt Readings from Last 3 Encounters:  11/26/16 207 lb 6.4 oz (94.1 kg)  08/10/16 200 lb (90.7 kg)  07/31/16 200 lb (90.7 kg)     -------------------------------------------------------------------  Elevated Hemoglobin A1C, Follow-up:   Lab Results  Component Value Date   HGBA1C 5.8 (H) 12/21/2015    Current symptoms include none and have been stable.   Pertinent Labs:    Component Value Date/Time   CHOL 151 12/21/2015 0941   TRIG 125 12/21/2015 0941   HDL 41 12/21/2015 0941   LDLCALC 85 12/21/2015 0941   CREATININE 0.55 07/31/2016 1330   CREATININE 0.81 10/23/2013 0551     GERD, Follow up:  The patient was last seen for GERD 11 months ago. Changes made since that visit include was started on Omeprazole.  She reports excellent compliance with treatment. She is not having side effects. .  She IS experiencing none. She is NOT experiencing belching, belching and eructation, choking on food, cough, deep pressure at base of neck, difficulty swallowing, fullness after meals, heartburn, hoarseness or nausea  ------------------------------------------------------------------------ She is having some increased swelling of her extremities with the L >R. This has been present since she had other medical issues and tendinitis/bursitis in August 2017. She is on amlodipine as well, but she does not want any change in medications at this time. She does have redness over the top of the left foot. She denies any pain or limited ROM.      No Known Allergies   Current Outpatient Prescriptions:  .  acetaminophen (TYLENOL) 500 MG tablet, Take 500 mg by mouth at bedtime. , Disp: , Rfl:  .  amLODipine (NORVASC) 5 MG tablet, TAKE 1 TABLET (5 MG TOTAL) BY MOUTH DAILY.,  Disp: 90 tablet, Rfl: 0 .  aspirin EC 81 MG tablet, Take by mouth., Disp: , Rfl:  .  calcium carbonate (OSCAL) 1500 (600 Ca) MG TABS tablet, Take 600 mg by mouth daily with lunch., Disp: , Rfl:  .  lisinopril (PRINIVIL,ZESTRIL) 40 MG tablet, Take 1 tablet (40 mg total) by mouth daily. (Patient taking differently: Take 40 mg by mouth 2 (two) times  daily in the am and at bedtime.Marland Kitchen ), Disp: 90 tablet, Rfl: 3 .  LORazepam (ATIVAN) 1 MG tablet, TAKE 1 TABLET BY MOUTH EVERY DAY, Disp: 30 tablet, Rfl: 0 .  Misc Natural Products (OSTEO BI-FLEX ADV DOUBLE ST PO), Take 1 tablet by mouth daily with lunch., Disp: , Rfl:  .  Multiple Vitamins-Minerals (CENTRUM SILVER 50+WOMEN) TABS, Take 1 tablet by mouth daily., Disp: , Rfl:  .  naproxen sodium (ALEVE) 220 MG tablet, Take 440 mg by mouth daily., Disp: , Rfl:  .  omeprazole (PRILOSEC) 20 MG capsule, Take 1 capsule (20 mg total) by mouth daily., Disp: 90 capsule, Rfl: 3 .  raloxifene (EVISTA) 60 MG tablet, TAKE 1 TABLET (60 MG TOTAL) BY MOUTH DAILY. (Patient taking differently: TAKE 1 TABLET (60 MG TOTAL) BY MOUTH DAILY in am.), Disp: 90 tablet, Rfl: 2 .  simvastatin (ZOCOR) 20 MG tablet, TAKE 1 TABLET (20 MG TOTAL) BY MOUTH DAILY., Disp: 90 tablet, Rfl: 1  Review of Systems  Constitutional: Negative.   Respiratory: Negative for cough, chest tightness, shortness of breath and wheezing.   Cardiovascular: Positive for leg swelling. Negative for chest pain and palpitations.  Gastrointestinal: Negative for abdominal pain, nausea and vomiting.  Musculoskeletal: Positive for arthralgias (feet). Negative for gait problem and myalgias.  Neurological: Negative for dizziness, weakness, light-headedness, numbness and headaches.    Social History  Substance Use Topics  . Smoking status: Former Smoker    Types: Cigarettes    Quit date: 08/01/1991  . Smokeless tobacco: Never Used     Comment: quit in 1992  . Alcohol use No   Objective:   BP 140/60 (BP Location: Right Arm, Patient Position: Sitting, Cuff Size: Large)   Pulse 76   Temp 98.2 F (36.8 C) (Oral)   Resp 16   Wt 207 lb 6.4 oz (94.1 kg)   BMI 35.60 kg/m   Physical Exam  Constitutional: She is oriented to person, place, and time. She appears well-developed and well-nourished. No distress.  HENT:  Head: Normocephalic and atraumatic.   Right Ear: External ear normal. Decreased hearing is noted.  Left Ear: External ear normal. Decreased hearing is noted.  Nose: Nose normal.  Mouth/Throat: Uvula is midline, oropharynx is clear and moist and mucous membranes are normal. No oropharyngeal exudate.  Eyes: Conjunctivae and EOM are normal. Pupils are equal, round, and reactive to light. Right eye exhibits no discharge. Left eye exhibits no discharge. No scleral icterus.  Neck: Normal range of motion. Neck supple. No JVD present. No tracheal deviation present. No thyromegaly present.  Cardiovascular: Normal rate, regular rhythm, normal heart sounds and intact distal pulses.  Exam reveals no gallop and no friction rub.   No murmur heard. Pulmonary/Chest: Effort normal and breath sounds normal. No respiratory distress. She has no decreased breath sounds. She has no wheezes. She has no rales. She exhibits no tenderness.  Abdominal: Soft. Bowel sounds are normal. She exhibits no distension and no mass. There is no tenderness. There is no rebound and no guarding.  Musculoskeletal: Normal range of motion. She exhibits edema (  1-2+ pitting edema). She exhibits no tenderness.  Lymphadenopathy:    She has no cervical adenopathy.  Neurological: She is alert and oriented to person, place, and time.  Skin: Skin is warm and dry. No rash noted. She is not diaphoretic.     Psychiatric: She has a normal mood and affect. Her behavior is normal. Judgment and thought content normal.  Vitals reviewed.      Assessment & Plan:     1. Essential hypertension Fairly stable. Continue amlodipine 5mg  daily and lisinopril 40mg  daily. Will check labs as below and f/u pending results. - lisinopril (PRINIVIL,ZESTRIL) 40 MG tablet; Take 1 tablet (40 mg total) by mouth daily.  Dispense: 90 tablet; Refill: 1 - amLODipine (NORVASC) 5 MG tablet; Take 1 tablet (5 mg total) by mouth daily.  Dispense: 90 tablet; Refill: 1 - CBC w/Diff/Platelet - Comprehensive  Metabolic Panel (CMET)  2. Gastroesophageal reflux disease, esophagitis presence not specified Stable. Diagnosis pulled for medication refill. Continue current medical treatment plan. - omeprazole (PRILOSEC) 20 MG capsule; Take 1 capsule (20 mg total) by mouth daily.  Dispense: 90 capsule; Refill: 1  3. Pure hypercholesterolemia Stable. Continue current medical treatment plan. She is to continue simvastatin 20 mg daily.  - CBC w/Diff/Platelet - Comprehensive Metabolic Panel (CMET) - Lipid Profile  4. Abnormal blood sugar Stable. Will check labs as below and f/u pending results. - Comprehensive Metabolic Panel (CMET) - HgB A1c  5. Anxiety Stable. Diagnosis pulled for medication refill. Continue current medical treatment plan. - LORazepam (ATIVAN) 1 MG tablet; Take 1 tablet (1 mg total) by mouth daily.  Dispense: 90 tablet; Refill: 1  6. Swelling of lower extremity New onset over the last 3-6 months. Patient has not been using compression stockings. Will add furosemide as below for as needed use to see if any improvement. Will monitor labs. She is to take potassium or eat potassium rich food when she takes this medication. Advised patient to also elevate legs when she is at rest. Suspect the discoloration over the top of the foot is related to venous discoloration. If redness worsens she is to call the office.   - furosemide (LASIX) 20 MG tablet; Take 1 tablet (20 mg total) by mouth daily as needed.  Dispense: 90 tablet; Refill: 1 - Comprehensive Metabolic Panel (CMET)       Mar Daring, PA-C  [facility]

## 2016-11-26 NOTE — Patient Instructions (Signed)
Furosemide tablets What is this medicine? FUROSEMIDE (fyoor [place] se mide) is a diuretic. It helps you make more urine and to lose salt and excess water from your body. This medicine is used to treat high blood pressure, and edema or swelling from heart, kidney, or liver disease. This medicine may be used for other purposes; ask your health care provider or pharmacist if you have questions. COMMON BRAND NAME(S): Active-Medicated Specimen Kit, Delone, Diuscreen, Lasix, RX Specimen Collection Kit, Specimen Collection Kit, URINX Medicated Specimen Collection What should I tell my health care provider before I take this medicine? They need to know if you have any of these conditions: -abnormal blood electrolytes -diarrhea or vomiting -gout -heart disease -kidney disease, small amounts of urine, or difficulty passing urine -liver disease -thyroid disease -an unusual or allergic reaction to furosemide, sulfa drugs, other medicines, foods, dyes, or preservatives -pregnant or trying to get pregnant -breast-feeding How should I use this medicine? Take this medicine by mouth with a glass of water. Follow the directions on the prescription label. You may take this medicine with or without food. If it upsets your stomach, take it with food or milk. Do not take your medicine more often than directed. Remember that you will need to pass more urine after taking this medicine. Do not take your medicine at a time of day that will cause you problems. Do not take at bedtime. Talk to your pediatrician regarding the use of this medicine in children. While this drug may be prescribed for selected conditions, precautions do apply. Overdosage: If you think you have taken too much of this medicine contact a poison control center or emergency room at once. NOTE: This medicine is only for you. Do not share this medicine with others. What if I miss a dose? If you miss a dose, take it as soon as you can. If it is almost time  for your next dose, take only that dose. Do not take double or extra doses. What may interact with this medicine? -aspirin and aspirin-like medicines -certain antibiotics -chloral hydrate -cisplatin -cyclosporine -digoxin -diuretics -laxatives -lithium -medicines for blood pressure -medicines that relax muscles for surgery -methotrexate -NSAIDs, medicines for pain and inflammation like ibuprofen, naproxen, or indomethacin -phenytoin -steroid medicines like prednisone or cortisone -sucralfate -thyroid hormones This list may not describe all possible interactions. Give your health care provider a list of all the medicines, herbs, non-prescription drugs, or dietary supplements you use. Also tell them if you smoke, drink alcohol, or use illegal drugs. Some items may interact with your medicine. What should I watch for while using this medicine? Visit your doctor or health care professional for regular checks on your progress. Check your blood pressure regularly. Ask your doctor or health care professional what your blood pressure should be, and when you should contact him or her. If you are a diabetic, check your blood sugar as directed. You may need to be on a special diet while taking this medicine. Check with your doctor. Also, ask how many glasses of fluid you need to drink a day. You must not get dehydrated. You may get drowsy or dizzy. Do not drive, use machinery, or do anything that needs mental alertness until you know how this drug affects you. Do not stand or sit up quickly, especially if you are an older patient. This reduces the risk of dizzy or fainting spells. Alcohol can make you more drowsy and dizzy. Avoid alcoholic drinks. This medicine can make you more sensitive  to the sun. Keep out of the sun. If you cannot avoid being in the sun, wear protective clothing and use sunscreen. Do not use sun lamps or tanning beds/booths. What side effects may I notice from receiving this  medicine? Side effects that you should report to your doctor or health care professional as soon as possible: -blood in urine or stools -dry mouth -fever or chills -hearing loss or ringing in the ears -irregular heartbeat -muscle pain or weakness, cramps -skin rash -stomach upset, pain, or nausea -tingling or numbness in the hands or feet -unusually weak or tired -vomiting or diarrhea -yellowing of the eyes or skin Side effects that usually do not require medical attention (report to your doctor or health care professional if they continue or are bothersome): -headache -loss of appetite -unusual bleeding or bruising This list may not describe all possible side effects. Call your doctor for medical advice about side effects. You may report side effects to FDA at 1-800-FDA-1088. Where should I keep my medicine? Keep out of the reach of children. Store at room temperature between 15 and 30 degrees C (59 and 86 degrees F). Protect from light. Throw away any unused medicine after the expiration date. NOTE: This sheet is a summary. It may not cover all possible information. If you have questions about this medicine, talk to your doctor, pharmacist, or health care provider.  2017 Elsevier/Gold Standard (2014-12-08 13:49:50)

## 2016-11-26 NOTE — Telephone Encounter (Signed)
Prescription for Lorazepam 1 MG tablet called in to [place]. Spoke with phamacist did not leave it on voicemail.   Thanks,  -Josslynn Mentzer

## 2016-11-28 DIAGNOSIS — R7309 Other abnormal glucose: Secondary | ICD-10-CM | POA: Diagnosis not present

## 2016-11-28 DIAGNOSIS — I10 Essential (primary) hypertension: Secondary | ICD-10-CM | POA: Diagnosis not present

## 2016-11-28 DIAGNOSIS — M7989 Other specified soft tissue disorders: Secondary | ICD-10-CM | POA: Diagnosis not present

## 2016-11-28 DIAGNOSIS — E7800 Pure hypercholesterolemia, unspecified: Secondary | ICD-10-CM | POA: Diagnosis not present

## 2016-11-29 ENCOUNTER — Telehealth: Payer: Self-pay

## 2016-11-29 LAB — CBC WITH DIFFERENTIAL/PLATELET
BASOS ABS: 0 10*3/uL (ref 0.0–0.2)
Basos: 1 %
EOS (ABSOLUTE): 0.2 10*3/uL (ref 0.0–0.4)
Eos: 2 %
HEMOGLOBIN: 15.8 g/dL (ref 11.1–15.9)
Hematocrit: 46.8 % — ABNORMAL HIGH (ref 34.0–46.6)
Immature Grans (Abs): 0 10*3/uL (ref 0.0–0.1)
Immature Granulocytes: 0 %
LYMPHS ABS: 1.9 10*3/uL (ref 0.7–3.1)
Lymphs: 29 %
MCH: 31.2 pg (ref 26.6–33.0)
MCHC: 33.8 g/dL (ref 31.5–35.7)
MCV: 92 fL (ref 79–97)
MONOCYTES: 8 %
MONOS ABS: 0.5 10*3/uL (ref 0.1–0.9)
Neutrophils Absolute: 3.9 10*3/uL (ref 1.4–7.0)
Neutrophils: 60 %
Platelets: 209 10*3/uL (ref 150–379)
RBC: 5.07 x10E6/uL (ref 3.77–5.28)
RDW: 14.2 % (ref 12.3–15.4)
WBC: 6.5 10*3/uL (ref 3.4–10.8)

## 2016-11-29 LAB — LIPID PANEL
CHOLESTEROL TOTAL: 165 mg/dL (ref 100–199)
Chol/HDL Ratio: 3.9 ratio units (ref 0.0–4.4)
HDL: 42 mg/dL (ref >39)
LDL CALC: 92 mg/dL (ref 0–99)
TRIGLYCERIDES: 154 mg/dL — AB (ref 0–149)
VLDL CHOLESTEROL CAL: 31 mg/dL (ref 5–40)

## 2016-11-29 LAB — COMPREHENSIVE METABOLIC PANEL
ALK PHOS: 92 IU/L (ref 39–117)
ALT: 16 IU/L (ref 0–32)
AST: 16 IU/L (ref 0–40)
Albumin/Globulin Ratio: 1.3 (ref 1.2–2.2)
Albumin: 4 g/dL (ref 3.5–4.7)
BILIRUBIN TOTAL: 0.3 mg/dL (ref 0.0–1.2)
BUN/Creatinine Ratio: 26 (ref 12–28)
BUN: 15 mg/dL (ref 8–27)
CHLORIDE: 101 mmol/L (ref 96–106)
CO2: 25 mmol/L (ref 18–29)
CREATININE: 0.57 mg/dL (ref 0.57–1.00)
Calcium: 9.2 mg/dL (ref 8.7–10.3)
GFR calc Af Amer: 101 mL/min/{1.73_m2} (ref >59)
GFR calc non Af Amer: 88 mL/min/{1.73_m2} (ref >59)
GLUCOSE: 93 mg/dL (ref 65–99)
Globulin, Total: 3.1 g/dL (ref 1.5–4.5)
Potassium: 4.1 mmol/L (ref 3.5–5.2)
Sodium: 144 mmol/L (ref 134–144)
Total Protein: 7.1 g/dL (ref 6.0–8.5)

## 2016-11-29 LAB — HEMOGLOBIN A1C
Est. average glucose Bld gHb Est-mCnc: 114 mg/dL
Hgb A1c MFr Bld: 5.6 % (ref 4.8–5.6)

## 2016-11-29 NOTE — Telephone Encounter (Signed)
Patient advised as directed below.  Thanks,  -Keiley Levey 

## 2016-11-29 NOTE — Telephone Encounter (Signed)
Patient advised as directed below.  Thanks,  -[place] 

## 2016-11-29 NOTE — Telephone Encounter (Signed)
-----   Message from Mar Daring, [place] sent at 11/29/2016  9:42 AM EST ----- All labs are within normal limits and stable.  Thanks! -JB

## 2016-12-24 ENCOUNTER — Other Ambulatory Visit: Payer: Self-pay

## 2016-12-31 ENCOUNTER — Other Ambulatory Visit: Payer: Self-pay

## 2016-12-31 DIAGNOSIS — K219 Gastro-esophageal reflux disease without esophagitis: Secondary | ICD-10-CM

## 2016-12-31 MED ORDER — OMEPRAZOLE 20 MG PO CPDR: 20.0000 mg | DELAYED_RELEASE_CAPSULE | Freq: Every day | ORAL | 1 refills | Status: DC

## 2016-12-31 NOTE — Telephone Encounter (Signed)
Refill request from [facility] on Omeprazole-aa

## 2017-02-20 ENCOUNTER — Encounter: Payer: Self-pay | Admitting: Family Medicine

## 2017-02-20 ENCOUNTER — Ambulatory Visit (INDEPENDENT_AMBULATORY_CARE_PROVIDER_SITE_OTHER): Payer: PPO | Admitting: Family Medicine

## 2017-02-20 VITALS — BP 138/70 | HR 64 | Temp 98.7°F | Resp 18 | Wt 207.0 lb

## 2017-02-20 DIAGNOSIS — M810 Age-related osteoporosis without current pathological fracture: Secondary | ICD-10-CM

## 2017-02-20 DIAGNOSIS — G459 Transient cerebral ischemic attack, unspecified: Secondary | ICD-10-CM

## 2017-02-20 DIAGNOSIS — I10 Essential (primary) hypertension: Secondary | ICD-10-CM

## 2017-02-20 NOTE — Progress Notes (Signed)
Patient: Christy Mills Female    DOB: [birth year]   83 y.o.   MRN: [id number] Visit Date: 02/20/2017  Today's Provider: Lelon Huh, MD   Chief Complaint  Patient presents with  . Numbness    x 2 days   Subjective:    HPI Numbness:  Patient comes in reporting that for the past 2 days she has had numbness in her lips. The numbness is more in the top left side of her hip. She has also had some numbness in her left thumb. Patient denies any chest pain, slurred speech, blurred vision or weakness. The numbness has occurred intermittently and last about 1 hour. Has no completely resolved. Numbness is on left side of lips and left thumb.   BP Readings from Last 3 Encounters:  02/20/17 138/70  11/26/16 140/60  08/10/16 126/87    Is on simvastatin for cholesterol which she is taking consistently and tolerating well.  Lab Results  Component Value Date   CHOL 165 11/28/2016   HDL 42 11/28/2016   LDLCALC 92 11/28/2016   TRIG 154 (H) 11/28/2016   CHOLHDL 3.9 11/28/2016    Takes 81mg  ASA in the morning. Takes 2 Aleve every day. Takes Evista for bone density. Was previously on Fosamax, but stopped due to upset stomach.     Allergies  Allergen Reactions  . Ciprofloxacin Hcl Other (See Comments)    tendinopathy     Current Outpatient Prescriptions:  .  acetaminophen (TYLENOL) 500 MG tablet, Take 500 mg by mouth at bedtime. , Disp: , Rfl:  .  amLODipine (NORVASC) 5 MG tablet, Take 1 tablet (5 mg total) by mouth daily., Disp: 90 tablet, Rfl: 1 .  aspirin EC 81 MG tablet, Take by mouth., Disp: , Rfl:  .  calcium carbonate (OSCAL) 1500 (600 Ca) MG TABS tablet, Take 600 mg by mouth daily with lunch., Disp: , Rfl:  .  furosemide (LASIX) 20 MG tablet, Take 1 tablet (20 mg total) by mouth daily as needed., Disp: 90 tablet, Rfl: 1 .  lisinopril (PRINIVIL,ZESTRIL) 40 MG tablet, Take 1 tablet (40 mg total) by mouth daily., Disp: 90 tablet, Rfl: 1 .  LORazepam (ATIVAN) 1 MG tablet, Take  1 tablet (1 mg total) by mouth daily., Disp: 90 tablet, Rfl: 1 .  Misc Natural Products (OSTEO BI-FLEX ADV DOUBLE ST PO), Take 1 tablet by mouth daily with lunch., Disp: , Rfl:  .  Multiple Vitamins-Minerals (CENTRUM SILVER 50+WOMEN) TABS, Take 1 tablet by mouth daily., Disp: , Rfl:  .  naproxen sodium (ALEVE) 220 MG tablet, Take 440 mg by mouth daily., Disp: , Rfl:  .  omeprazole (PRILOSEC) 20 MG capsule, Take 1 capsule (20 mg total) by mouth daily., Disp: 90 capsule, Rfl: 1 .  potassium chloride (K-DUR) 10 MEQ tablet, Take 1 tablet (10 mEq total) by mouth daily. On days she takes furosemide, Disp: 90 tablet, Rfl: 1 .  raloxifene (EVISTA) 60 MG tablet, TAKE 1 TABLET (60 MG TOTAL) BY MOUTH DAILY. (Patient taking differently: TAKE 1 TABLET (60 MG TOTAL) BY MOUTH DAILY in am.), Disp: 90 tablet, Rfl: 2 .  simvastatin (ZOCOR) 20 MG tablet, TAKE 1 TABLET (20 MG TOTAL) BY MOUTH DAILY., Disp: 90 tablet, Rfl: 1  Review of Systems  Constitutional: Negative for appetite change, chills, fatigue and fever.  Respiratory: Negative for chest tightness and shortness of breath.   Cardiovascular: Negative for chest pain and palpitations.  Gastrointestinal: Negative for abdominal pain, nausea  and vomiting.  Neurological: Positive for numbness (in lips and thumb). Negative for dizziness and weakness.    Social History  Substance Use Topics  . Smoking status: Former Smoker    Types: Cigarettes    Quit date: 08/01/1991  . Smokeless tobacco: Never Used     Comment: quit in 1992  . Alcohol use No   Objective:   BP 138/70 (BP Location: Left Arm, Patient Position: Sitting, Cuff Size: Large)   Pulse 64   Temp 98.7 F (37.1 C) (Oral)   Resp 18   Wt 207 lb (93.9 kg)   SpO2 98% Comment: room air  BMI 35.53 kg/m  There were no vitals filed for this visit.   Physical Exam   General Appearance:    Alert, cooperative, no distress  Eyes:    PERRL, conjunctiva/corneas clear, EOM's intact       Lungs:      Clear to auscultation bilaterally, respirations unlabored  Heart:    Regular rate and rhythm  Neurologic:   Awake, alert, oriented x 3. No apparent focal neurological           defect. No s/s deficits. MS +5/5 throughout. CN II-XII intact.           Assessment & Plan:     1. Transient cerebral ischemia, unspecified type (suspected) Stop Evista and all NSAIDs. Take 2x81 ASA while work up is ongoing.  - US Carotid Duplex Bilateral; Future  2. Essential hypertension Well controlled.  Continue current medications.    3. Osteoporosis without current pathological fracture, unspecified osteoporosis type Has been on Evista for nearly 20 days by patient report with stable findings on BMD. She should be fine off of this medication for awhile, and she did not tolerate Fosamax in the past.        Lelon Huh, MD  [place]

## 2017-02-20 NOTE — Patient Instructions (Addendum)
   Stop taking Evista, ibuprofen and Aleve for the time being.    You may take OTC Tylenol or extra strength Tylenol for arthritis pain   Increase aspirin to 2 x 81mg  tablets a day

## 2017-02-21 ENCOUNTER — Other Ambulatory Visit: Payer: Self-pay | Admitting: Family Medicine

## 2017-02-22 ENCOUNTER — Ambulatory Visit
Admission: RE | Admit: 2017-02-22 | Discharge: 2017-02-22 | Disposition: A | Discharge status: Home / Self Care | Payer: PPO | Source: Ambulatory Visit | Attending: Family Medicine | Admitting: Family Medicine

## 2017-02-22 DIAGNOSIS — G459 Transient cerebral ischemic attack, unspecified: Secondary | ICD-10-CM | POA: Diagnosis not present

## 2017-02-23 ENCOUNTER — Encounter: Payer: Self-pay | Admitting: Family Medicine

## 2017-02-23 ENCOUNTER — Other Ambulatory Visit: Payer: Self-pay | Admitting: Family Medicine

## 2017-02-23 DIAGNOSIS — E041 Nontoxic single thyroid nodule: Secondary | ICD-10-CM

## 2017-03-01 ENCOUNTER — Ambulatory Visit
Admission: RE | Admit: 2017-03-01 | Discharge: 2017-03-01 | Disposition: A | Discharge status: Home / Self Care | Payer: PPO | Source: Ambulatory Visit | Attending: Family Medicine | Admitting: Family Medicine

## 2017-03-01 DIAGNOSIS — E041 Nontoxic single thyroid nodule: Secondary | ICD-10-CM | POA: Diagnosis not present

## 2017-03-01 DIAGNOSIS — E042 Nontoxic multinodular goiter: Secondary | ICD-10-CM | POA: Insufficient documentation

## 2017-03-04 ENCOUNTER — Other Ambulatory Visit: Payer: Self-pay | Admitting: Family Medicine

## 2017-03-04 DIAGNOSIS — E041 Nontoxic single thyroid nodule: Secondary | ICD-10-CM

## 2017-03-05 ENCOUNTER — Telehealth: Payer: Self-pay

## 2017-03-05 DIAGNOSIS — E041 Nontoxic single thyroid nodule: Secondary | ICD-10-CM

## 2017-03-05 NOTE — Telephone Encounter (Signed)
Patient advised and agrees to referral. Lab slip created and placed up front for pick up.      Notes recorded by Birdie Sons, MD on 03/04/2017 at 8:02 AM EDT Ultrasound show nodule on left side of thyroid that needs further evaluation. Needs T4 and TSH and needs referral to ENT for thyroid nodule. Have sent order for referral to [facility], please print lab order for patient to pick up.

## 2017-03-08 DIAGNOSIS — E041 Nontoxic single thyroid nodule: Secondary | ICD-10-CM | POA: Diagnosis not present

## 2017-03-09 ENCOUNTER — Telehealth: Payer: Self-pay | Admitting: Family Medicine

## 2017-03-09 ENCOUNTER — Other Ambulatory Visit: Payer: Self-pay | Admitting: Family Medicine

## 2017-03-09 DIAGNOSIS — E041 Nontoxic single thyroid nodule: Secondary | ICD-10-CM

## 2017-03-09 LAB — T4 AND TSH
T4, Total: 8.4 ug/dL (ref 4.5–12.0)
TSH: 1.05 u[IU]/mL (ref 0.450–4.500)

## 2017-03-09 NOTE — Telephone Encounter (Signed)
I entered order for ENT referral for new thyroid nodule on this patient. She is already followed by Dr. Charolett Bumpers for history of parotid tumor and states she has a follow up  appointment scheduled on June 29th. Patient does not want to make a separate appointment for the thyroid nodule. Can you check with Dr. Maisie Fus office to see if she needs a separate appointment for the thyroid nodule or if Dr. Kathyrn Sheriff can address it at o.v. That is already scheduled. Thanks!  Dr. Wanda Plump

## 2017-03-12 NOTE — Telephone Encounter (Signed)
The appointment on Jun 29th to see Dr Kathyrn Sheriff is for the thyroid nodule.Per Levada Dy in Dr Maisie Fus office they have not seen pt for parotid tumor since 2012

## 2017-03-25 DIAGNOSIS — Z9841 Cataract extraction status, right eye: Secondary | ICD-10-CM | POA: Diagnosis not present

## 2017-03-25 DIAGNOSIS — Z9842 Cataract extraction status, left eye: Secondary | ICD-10-CM | POA: Diagnosis not present

## 2017-03-25 DIAGNOSIS — I10 Essential (primary) hypertension: Secondary | ICD-10-CM | POA: Diagnosis not present

## 2017-03-25 DIAGNOSIS — H35033 Hypertensive retinopathy, bilateral: Secondary | ICD-10-CM | POA: Diagnosis not present

## 2017-03-26 LAB — HM DIABETES EYE EXAM

## 2017-03-29 DIAGNOSIS — E041 Nontoxic single thyroid nodule: Secondary | ICD-10-CM | POA: Diagnosis not present

## 2017-04-01 ENCOUNTER — Other Ambulatory Visit: Payer: Self-pay | Admitting: Otolaryngology

## 2017-04-01 DIAGNOSIS — E041 Nontoxic single thyroid nodule: Secondary | ICD-10-CM

## 2017-04-04 ENCOUNTER — Encounter: Payer: Self-pay | Admitting: Physician Assistant

## 2017-04-11 ENCOUNTER — Ambulatory Visit
Admission: RE | Admit: 2017-04-11 | Discharge: 2017-04-11 | Disposition: A | Discharge status: Home / Self Care | Payer: PPO | Source: Ambulatory Visit | Attending: Otolaryngology | Admitting: Otolaryngology

## 2017-04-11 DIAGNOSIS — E041 Nontoxic single thyroid nodule: Secondary | ICD-10-CM

## 2017-04-11 NOTE — Procedures (Signed)
Pre Procedure Dx: Thyroid Nodule Post Procedural Dx: Same  Technically successful US guided biopsy of Indeterminate nodule within the inferior pole of the left lobe of the thyroid.  EBL: None  No immediate complications.   Ronny Bacon, MD Pager #: [phone]

## 2017-04-12 LAB — CYTOLOGY - NON PAP

## 2017-04-15 ENCOUNTER — Other Ambulatory Visit: Payer: Self-pay | Admitting: Otolaryngology

## 2017-04-15 DIAGNOSIS — E041 Nontoxic single thyroid nodule: Secondary | ICD-10-CM

## 2017-04-17 ENCOUNTER — Telehealth: Payer: Self-pay | Admitting: Physician Assistant

## 2017-04-17 DIAGNOSIS — Z1239 Encounter for other screening for malignant neoplasm of breast: Secondary | ICD-10-CM

## 2017-04-17 NOTE — Telephone Encounter (Signed)
Pt is requesting an order sent [place] to have a 3D mammogram.  [phone]

## 2017-04-17 NOTE — Telephone Encounter (Signed)
Patient advised as directed below.  Thanks,  -Nelia Rogoff 

## 2017-04-17 NOTE — Telephone Encounter (Signed)
Ordered

## 2017-04-17 NOTE — Telephone Encounter (Signed)
Please advise 

## 2017-04-18 ENCOUNTER — Other Ambulatory Visit: Payer: Self-pay | Admitting: Physician Assistant

## 2017-04-18 DIAGNOSIS — Z1239 Encounter for other screening for malignant neoplasm of breast: Secondary | ICD-10-CM

## 2017-04-21 ENCOUNTER — Other Ambulatory Visit: Payer: Self-pay | Admitting: Family Medicine

## 2017-04-21 DIAGNOSIS — E785 Hyperlipidemia, unspecified: Secondary | ICD-10-CM

## 2017-04-22 NOTE — Telephone Encounter (Signed)
See refill request.

## 2017-05-16 ENCOUNTER — Ambulatory Visit
Admission: RE | Admit: 2017-05-16 | Discharge: 2017-05-16 | Disposition: A | Discharge status: Home / Self Care | Payer: PPO | Source: Ambulatory Visit | Attending: Physician Assistant | Admitting: Physician Assistant

## 2017-05-16 DIAGNOSIS — Z1239 Encounter for other screening for malignant neoplasm of breast: Secondary | ICD-10-CM

## 2017-05-16 DIAGNOSIS — Z1231 Encounter for screening mammogram for malignant neoplasm of breast: Secondary | ICD-10-CM | POA: Diagnosis not present

## 2017-05-23 ENCOUNTER — Telehealth: Payer: Self-pay | Admitting: Family Medicine

## 2017-05-23 NOTE — Telephone Encounter (Signed)
Called patent to change appt day.  She has HTA and cannot do AWV and see MD on same day-ab

## 2017-05-31 ENCOUNTER — Ambulatory Visit (INDEPENDENT_AMBULATORY_CARE_PROVIDER_SITE_OTHER): Payer: PPO

## 2017-05-31 VITALS — BP 134/66 | HR 72 | Temp 98.9°F | Ht 64.0 in | Wt 203.4 lb

## 2017-05-31 DIAGNOSIS — Z0000 Encounter for general adult medical examination without abnormal findings: Secondary | ICD-10-CM | POA: Diagnosis not present

## 2017-05-31 NOTE — Progress Notes (Signed)
Subjective:   Christy Mills is a 83 y.o. female who presents for Medicare Annual (Subsequent) preventive examination.  Review of Systems:  N/A  Cardiac Risk Factors include: advanced age (>61men, >18 women);dyslipidemia;hypertension     Objective:     Vitals: BP 134/66 (BP Location: Left Arm)   Pulse 72   Temp 98.9 F (37.2 C) (Oral)   Ht 5\' 4"  (1.626 m)   Wt 203 lb 6.4 oz (92.3 kg)   BMI 34.91 kg/m   Body mass index is 34.91 kg/m.   Tobacco History  Smoking Status  . Former Smoker  . Types: Cigarettes  . Quit date: 08/01/1991  Smokeless Tobacco  . Never Used    Comment: quit in 1992     Counseling given: Not Answered   Past Medical History:  Diagnosis Date  . Anxiety 1980   difficulty sleeping after mother died.  . Arthritis   . Cancer ([place])    skin resected from nose in 2014.  Marland Kitchen History of trigger finger   . Hyperlipidemia   . Hypertension   . Osteoporosis    Past Surgical History:  Procedure Laterality Date  . APPENDECTOMY  1978  . CATARACT EXTRACTION W/ INTRAOCULAR LENS IMPLANT Bilateral 2004   one eye than the other a month laster  . HYSTEROSCOPY W/D&C N/A 08/10/2016   Procedure: DILATATION AND CURETTAGE /HYSTEROSCOPY;  Surgeon: Honor Loh Ward, MD;  Location: [facility] ORS;  Service: Gynecology;  Laterality: N/A;  . JOINT REPLACEMENT Right 2015  . MOHS SURGERY  2014   nose  . OOPHORECTOMY Right   . 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 TOTAL KNEE ARTHROPLASTY Right 10/2013   Family History  Problem Relation Age of Onset  . Breast cancer Mother 28  . Hypertension Mother   . Diabetes Father        type 2  . Hypertension Father   . Cancer Father        lung  . Colon polyps Father   . Cancer Brother        brain  . Diabetes Brother        type 2   History  Sexual Activity  . Sexual activity: Not on file    Outpatient Encounter Prescriptions as of 05/31/2017  Medication Sig  . acetaminophen (TYLENOL) 500  MG tablet Take 500 mg by mouth.   Marland Kitchen amLODipine (NORVASC) 5 MG tablet Take 1 tablet (5 mg total) by mouth daily.  Marland Kitchen aspirin EC 81 MG tablet Take 162 mg by mouth.   . calcium carbonate (OSCAL) 1500 (600 Ca) MG TABS tablet Take 600 mg of elemental calcium by mouth 2 (two) times daily with a meal.   . furosemide (LASIX) 20 MG tablet Take 1 tablet (20 mg total) by mouth daily as needed.  Marland Kitchen lisinopril (PRINIVIL,ZESTRIL) 40 MG tablet Take 1 tablet (40 mg total) by mouth daily.  Marland Kitchen LORazepam (ATIVAN) 1 MG tablet Take 1 tablet (1 mg total) by mouth daily.  . Misc Natural Products (OSTEO BI-FLEX ADV DOUBLE ST PO) Take 1 tablet by mouth daily with lunch.  . Multiple Vitamins-Minerals (CENTRUM SILVER 50+WOMEN) TABS Take 1 tablet by mouth daily.  Marland Kitchen omeprazole (PRILOSEC) 20 MG capsule Take 1 capsule (20 mg total) by mouth daily.  . potassium chloride (K-DUR) 10 MEQ tablet Take 1 tablet (10 mEq total) by mouth daily. On days she takes furosemide  . simvastatin (ZOCOR) 20 MG tablet TAKE  1 TABLET (20 MG TOTAL) BY MOUTH DAILY.   No facility-administered encounter medications on file as of 05/31/2017.     Activities of Daily Living In your present state of health, do you have any difficulty performing the following activities: 05/31/2017 07/31/2016  Hearing? Y Y  Comment waer bilateral hearing aids wears 2 hearing aides  Vision? N N  Comment - wears reading glasses  Difficulty concentrating or making decisions? N N  Walking or climbing stairs? Y N  Comment previously had right knee relacement surgery  -  Dressing or bathing? N N  Doing errands, shopping? N N  Preparing Food and eating ? N -  Using the Toilet? N -  In the past six months, have you accidently leaked urine? N -  Do you have problems with loss of bowel control? N -  Managing your Medications? N -  Managing your Finances? N -  Housekeeping or managing your Housekeeping? N -  Some recent data might be hidden    Patient Care Team: Birdie Sons, MD as PCP - General (Family Medicine) Thelma Comp, [place] as Consulting Physician (Optometry) Hooten, Laurice Record, MD as Consulting Physician (Orthopedic Surgery)    Assessment:     Exercise Activities and Dietary recommendations Current Exercise Habits: Home exercise routine, Type of exercise: stretching, Time (Minutes): 10 (to 15 minutes), Frequency (Times/Week): 2 (to 3 days), Weekly Exercise (Minutes/Week): 20, Intensity: Mild, Exercise limited by: orthopedic condition(s)  Goals    . Reduce sugar intake           Recommend decreasing sugar in daily diet. Pt cut out eating desserts.       Fall Risk Fall Risk  05/31/2017 12/19/2015  Falls in the past year? Yes No  Number falls in past yr: 1 -  Injury with Fall? No -  Follow up Falls prevention discussed -   Depression Screen PHQ 2/9 Scores 05/31/2017 12/19/2015  PHQ - 2 Score 0 0     Cognitive Function        Immunization History  Administered Date(s) Administered  . Influenza-Unspecified 08/27/2015  . Pneumococcal Conjugate-13 05/13/2014  . Pneumococcal Polysaccharide-23 08/17/2004  . Pneumococcal-Unspecified 08/17/2004  . Td 07/27/2005  . Zoster 07/04/2012   Screening Tests Health Maintenance  Topic Date Due  . INFLUENZA VACCINE  05/01/2017  . TETANUS/TDAP  05/01/2018 (Originally 07/28/2015)  . DEXA SCAN  Completed  . PNA vac Low Risk Adult  Completed      Plan:  I have personally reviewed and addressed the Medicare Annual Wellness questionnaire and have noted the following in the patient's chart:  A. Medical and social history B. Use of alcohol, tobacco or illicit drugs  C. Current medications and supplements D. Functional ability and status E.  Nutritional status F.  Physical activity G. Advance directives H. List of other physicians I.  Hospitalizations, surgeries, and ER visits in previous 12 months J.  [place] such as hearing and vision if needed, cognitive and  depression L. Referrals and appointments - none  In addition, I have reviewed and discussed with patient certain preventive protocols, quality metrics, and best practice recommendations. A written personalized care plan for preventive services as well as general preventive health recommendations were provided to patient.  See attached scanned questionnaire for additional information.   Signed,  Fabio Neighbors, LPN Nurse Health Advisor   MD Recommendations: Pt declined influenza and tetanus vaccines today.

## 2017-05-31 NOTE — Patient Instructions (Signed)
Christy Mills , Thank you for taking time to come for your Medicare Wellness Visit. I appreciate your ongoing commitment to your health goals. Please review the following plan we discussed and let me know if I can assist you in the future.   Screening recommendations/referrals: Colonoscopy: up to date, no longer required Mammogram: up to date Bone Density: up to date Recommended yearly ophthalmology/optometry visit for glaucoma screening and checkup Recommended yearly dental visit for hygiene and checkup  Vaccinations: Influenza vaccine: declined today Pneumococcal vaccine: completed series Tdap vaccine: declined today Shingles vaccine: completed 07/08/12  Advanced directives: Please bring a copy of your POA (Power of [place]) and/or Living Will to your next appointment.   Conditions/risks identified: Fall risk prevention; Obesity- Recommend decreasing sugar in daily diet. Pt cut out eating desserts.   Next appointment: 06/05/17 @ 3:00 PM   Preventive Care 65 Years and Older, Female Preventive care refers to lifestyle choices and visits with your health care provider that can promote health and wellness. What does preventive care include?  A yearly physical exam. This is also called an annual well check.  Dental exams once or twice a year.  Routine eye exams. Ask your health care provider how often you should have your eyes checked.  Personal lifestyle choices, including:  Daily care of your teeth and gums.  Regular physical activity.  Eating a healthy diet.  Avoiding tobacco and drug use.  Limiting alcohol use.  Practicing safe sex.  Taking low-dose aspirin every day.  Taking vitamin and mineral supplements as recommended by your health care provider. What happens during an annual well check? The services and screenings done by your health care provider during your annual well check will depend on your age, overall health, lifestyle risk factors, and family history of  disease. Counseling  Your health care provider may ask you questions about your:  Alcohol use.  Tobacco use.  Drug use.  Emotional well-being.  Home and relationship well-being.  Sexual activity.  Eating habits.  History of falls.  Memory and ability to understand (cognition).  Work and work Statistician.  Reproductive health. Screening  You may have the following tests or measurements:  Height, weight, and BMI.  Blood pressure.  Lipid and cholesterol levels. These may be checked every 5 years, or more frequently if you are over 6 years old.  Skin check.  Lung cancer screening. You may have this screening every year starting at age 59 if you have a 30-pack-year history of smoking and currently smoke or have quit within the past 15 years.  Fecal occult blood test (FOBT) of the stool. You may have this test every year starting at age 66.  Flexible sigmoidoscopy or colonoscopy. You may have a sigmoidoscopy every 5 years or a colonoscopy every 10 years starting at age 42.  Hepatitis C blood test.  Hepatitis B blood test.  Sexually transmitted disease (STD) testing.  Diabetes screening. This is done by checking your blood sugar (glucose) after you have not eaten for a while (fasting). You may have this done every 1-3 years.  Bone density scan. This is done to screen for osteoporosis. You may have this done starting at age 110.  Mammogram. This may be done every 1-2 years. Talk to your health care provider about how often you should have regular mammograms. Talk with your health care provider about your test results, treatment options, and if necessary, the need for more tests. Vaccines  Your health care provider may recommend certain  vaccines, such as:  Influenza vaccine. This is recommended every year.  Tetanus, diphtheria, and acellular pertussis (Tdap, Td) vaccine. You may need a Td booster every 10 years.  Zoster vaccine. You may need this after age  59.  Pneumococcal 13-valent conjugate (PCV13) vaccine. One dose is recommended after age 59.  Pneumococcal polysaccharide (PPSV23) vaccine. One dose is recommended after age 21. Talk to your health care provider about which screenings and vaccines you need and how often you need them. This information is not intended to replace advice given to you by your health care provider. Make sure you discuss any questions you have with your health care provider. Document Released: 10/14/2015 Document Revised: 06/06/2016 Document Reviewed: 07/19/2015 Elsevier Interactive Patient Education  2017 [place] Prevention in the Home Falls can cause injuries. They can happen to people of all ages. There are many things you can do to make your home safe and to help prevent falls. What can I do on the outside of my home?  Regularly fix the edges of walkways and driveways and fix any cracks.  Remove anything that might make you trip as you walk through a door, such as a raised step or threshold.  Trim any bushes or trees on the path to your home.  Use bright outdoor lighting.  Clear any walking paths of anything that might make someone trip, such as rocks or tools.  Regularly check to see if handrails are loose or broken. Make sure that both sides of any steps have handrails.  Any raised decks and porches should have guardrails on the edges.  Have any leaves, snow, or ice cleared regularly.  Use sand or salt on walking paths during winter.  Clean up any spills in your garage right away. This includes oil or grease spills. What can I do in the bathroom?  Use night lights.  Install grab bars by the toilet and in the tub and shower. Do not use towel bars as grab bars.  Use non-skid mats or decals in the tub or shower.  If you need to sit down in the shower, use a plastic, non-slip stool.  Keep the floor dry. Clean up any water that spills on the floor as soon as it happens.  Remove  soap buildup in the tub or shower regularly.  Attach bath mats securely with double-sided non-slip rug tape.  Do not have throw rugs and other things on the floor that can make you trip. What can I do in the bedroom?  Use night lights.  Make sure that you have a light by your bed that is easy to reach.  Do not use any sheets or blankets that are too big for your bed. They should not hang down onto the floor.  Have a firm chair that has side arms. You can use this for support while you get dressed.  Do not have throw rugs and other things on the floor that can make you trip. What can I do in the kitchen?  Clean up any spills right away.  Avoid walking on wet floors.  Keep items that you use a lot in easy-to-reach places.  If you need to reach something above you, use a strong step stool that has a grab bar.  Keep electrical cords out of the way.  Do not use floor polish or wax that makes floors slippery. If you must use wax, use non-skid floor wax.  Do not have throw rugs and other things  on the floor that can make you trip. What can I do with my stairs?  Do not leave any items on the stairs.  Make sure that there are handrails on both sides of the stairs and use them. Fix handrails that are broken or loose. Make sure that handrails are as long as the stairways.  Check any carpeting to make sure that it is firmly attached to the stairs. Fix any carpet that is loose or worn.  Avoid having throw rugs at the top or bottom of the stairs. If you do have throw rugs, attach them to the floor with carpet tape.  Make sure that you have a light switch at the top of the stairs and the bottom of the stairs. If you do not have them, ask someone to add them for you. What else can I do to help prevent falls?  Wear shoes that:  Do not have high heels.  Have rubber bottoms.  Are comfortable and fit you well.  Are closed at the toe. Do not wear sandals.  If you use a  stepladder:  Make sure that it is fully opened. Do not climb a closed stepladder.  Make sure that both sides of the stepladder are locked into place.  Ask someone to hold it for you, if possible.  Clearly mark and make sure that you can see:  Any grab bars or handrails.  First and last steps.  Where the edge of each step is.  Use tools that help you move around (mobility aids) if they are needed. These include:  Canes.  Walkers.  Scooters.  Crutches.  Turn on the lights when you go into a dark area. Replace any light bulbs as soon as they burn out.  Set up your furniture so you have a clear path. Avoid moving your furniture around.  If any of your floors are uneven, fix them.  If there are any pets around you, be aware of where they are.  Review your medicines with your doctor. Some medicines can make you feel dizzy. This can increase your chance of falling. Ask your doctor what other things that you can do to help prevent falls. This information is not intended to replace advice given to you by your health care provider. Make sure you discuss any questions you have with your health care provider. Document Released: 07/14/2009 Document Revised: 02/23/2016 Document Reviewed: 10/22/2014 Elsevier Interactive Patient Education  2017 [employer].

## 2017-06-05 ENCOUNTER — Ambulatory Visit: Payer: PPO

## 2017-06-05 ENCOUNTER — Encounter: Payer: PPO | Admitting: Family Medicine

## 2017-06-13 ENCOUNTER — Encounter: Payer: PPO | Admitting: Family Medicine

## 2017-06-22 ENCOUNTER — Other Ambulatory Visit: Payer: Self-pay | Admitting: Physician Assistant

## 2017-06-22 DIAGNOSIS — I10 Essential (primary) hypertension: Secondary | ICD-10-CM

## 2017-06-25 ENCOUNTER — Other Ambulatory Visit: Payer: Self-pay | Admitting: Family Medicine

## 2017-06-25 DIAGNOSIS — F419 Anxiety disorder, unspecified: Secondary | ICD-10-CM

## 2017-06-25 NOTE — Telephone Encounter (Signed)
Please call in lorazepam.  

## 2017-06-25 NOTE — Telephone Encounter (Signed)
[facility] faxed a refill request on the following medications:  omeprazole (PRILOSEC) 20 MG capsule.  Tale 1 capsule by mouth daily.  90 day supply.  [facility] [address]

## 2017-06-26 NOTE — Telephone Encounter (Signed)
prescription called into pharmacy. KW

## 2017-06-27 ENCOUNTER — Telehealth: Payer: Self-pay | Admitting: Family Medicine

## 2017-06-27 ENCOUNTER — Telehealth: Payer: Self-pay | Admitting: Physician Assistant

## 2017-06-27 DIAGNOSIS — K219 Gastro-esophageal reflux disease without esophagitis: Secondary | ICD-10-CM

## 2017-06-27 MED ORDER — OMEPRAZOLE 20 MG PO CPDR: 20.0000 mg | DELAYED_RELEASE_CAPSULE | Freq: Every day | ORAL | 1 refills | Status: DC

## 2017-06-27 NOTE — Telephone Encounter (Signed)
Last filled 12/31/16

## 2017-06-27 NOTE — Telephone Encounter (Signed)
Rx refilled.

## 2017-06-27 NOTE — Telephone Encounter (Signed)
[facility] a request on the following medication. Thanks [facility]  omeprazole (PRILOSEC) 20 MG capsule  >Take 1 capsule ( 20 MG Total ) by mouth daily.

## 2017-06-27 NOTE — Telephone Encounter (Signed)
Open in error

## 2017-07-21 ENCOUNTER — Other Ambulatory Visit: Payer: Self-pay | Admitting: Physician Assistant

## 2017-07-21 DIAGNOSIS — I10 Essential (primary) hypertension: Secondary | ICD-10-CM

## 2017-07-23 DIAGNOSIS — M1712 Unilateral primary osteoarthritis, left knee: Secondary | ICD-10-CM | POA: Diagnosis not present

## 2017-07-23 DIAGNOSIS — Z96651 Presence of right artificial knee joint: Secondary | ICD-10-CM | POA: Diagnosis not present

## 2017-07-23 DIAGNOSIS — G8929 Other chronic pain: Secondary | ICD-10-CM | POA: Diagnosis not present

## 2017-07-23 DIAGNOSIS — M25562 Pain in left knee: Secondary | ICD-10-CM | POA: Diagnosis not present

## 2017-07-24 DIAGNOSIS — M1712 Unilateral primary osteoarthritis, left knee: Secondary | ICD-10-CM | POA: Insufficient documentation

## 2017-10-17 ENCOUNTER — Other Ambulatory Visit: Payer: Self-pay | Admitting: Physician Assistant

## 2017-10-17 DIAGNOSIS — E785 Hyperlipidemia, unspecified: Secondary | ICD-10-CM

## 2017-12-06 ENCOUNTER — Ambulatory Visit (INDEPENDENT_AMBULATORY_CARE_PROVIDER_SITE_OTHER): Payer: PPO | Admitting: Family Medicine

## 2017-12-06 ENCOUNTER — Encounter: Payer: Self-pay | Admitting: Family Medicine

## 2017-12-06 VITALS — BP 122/60 | HR 64 | Temp 98.1°F | Resp 18

## 2017-12-06 DIAGNOSIS — M2550 Pain in unspecified joint: Secondary | ICD-10-CM | POA: Diagnosis not present

## 2017-12-06 MED ORDER — MELOXICAM 7.5 MG PO TABS
7.5000 mg | ORAL_TABLET | Freq: Every day | ORAL | 0 refills | Status: DC
Start: 2017-12-06 — End: 2017-12-20

## 2017-12-06 MED ORDER — DICLOFENAC SODIUM 1 % TD GEL: 4.0000 g | Freq: Four times a day (QID) | TRANSDERMAL | 2 refills | Status: DC

## 2017-12-06 NOTE — Progress Notes (Signed)
Patient: Christy Mills Female    DOB: December 29, 1933   84 y.o.   MRN: [id number] Visit Date: 12/06/2017  Today's Provider: Lelon Huh, MD   Chief Complaint  Patient presents with  . Joint Pain   Subjective:    HPI Joint pain:  Patient presents today complaining of joint pain in her shoulders and hands. She has been taking Tylenol to help with pain. Patient says the pain in her left shoulder hurts the most. She reports she fell 10-12 years ago and injured her shoulder. It still hurts to lift her left arm.      Allergies  Allergen Reactions  . Ciprofloxacin Hcl Other (See Comments)    tendinopathy     Current Outpatient Medications:  .  acetaminophen (TYLENOL) 500 MG tablet, Take 500 mg by mouth. , Disp: , Rfl:  .  amLODipine (NORVASC) 5 MG tablet, TAKE 1 TABLET (5 MG TOTAL) BY MOUTH DAILY., Disp: 90 tablet, Rfl: 1 .  aspirin EC 81 MG tablet, Take 162 mg by mouth. , Disp: , Rfl:  .  calcium carbonate (OSCAL) 1500 (600 Ca) MG TABS tablet, Take 600 mg of elemental calcium by mouth 2 (two) times daily with a meal. , Disp: , Rfl:  .  furosemide (LASIX) 20 MG tablet, Take 1 tablet (20 mg total) by mouth daily as needed., Disp: 90 tablet, Rfl: 1 .  lisinopril (PRINIVIL,ZESTRIL) 40 MG tablet, TAKE 1 TABLET (40 MG TOTAL) BY MOUTH DAILY., Disp: 90 tablet, Rfl: 1 .  LORazepam (ATIVAN) 1 MG tablet, TAKE 1 TABLET BY MOUTH EVERY DAY, Disp: 90 tablet, Rfl: 4 .  Misc Natural Products (OSTEO BI-FLEX ADV DOUBLE ST PO), Take 1 tablet by mouth daily with lunch., Disp: , Rfl:  .  Multiple Vitamins-Minerals (CENTRUM SILVER 50+WOMEN) TABS, Take 1 tablet by mouth daily., Disp: , Rfl:  .  omeprazole (PRILOSEC) 20 MG capsule, Take 1 capsule (20 mg total) by mouth daily., Disp: 90 capsule, Rfl: 1 .  potassium chloride (K-DUR) 10 MEQ tablet, Take 1 tablet (10 mEq total) by mouth daily. On days she takes furosemide, Disp: 90 tablet, Rfl: 1 .  simvastatin (ZOCOR) 20 MG tablet, TAKE 1 TABLET BY MOUTH EVERY  DAY, Disp: 90 tablet, Rfl: 1  Review of Systems  Constitutional: Negative for appetite change, chills, fatigue and fever.  Respiratory: Negative for chest tightness and shortness of breath.   Cardiovascular: Negative for chest pain and palpitations.  Gastrointestinal: Negative for abdominal pain, nausea and vomiting.  Musculoskeletal: Positive for arthralgias.  Neurological: Negative for dizziness and weakness.    Social History   Tobacco Use  . Smoking status: Former Smoker    Types: Cigarettes    Last attempt to quit: 08/01/1991    Years since quitting: 26.3  . Smokeless tobacco: Never Used  . Tobacco comment: quit in 1992  Substance Use Topics  . Alcohol use: No    Alcohol/week: 0.0 oz   Objective:   BP 122/60 (BP Location: Left Arm, Patient Position: Sitting, Cuff Size: Large)   Pulse 64   Temp 98.1 F (36.7 C) (Oral)   Resp 18   SpO2 99% Comment: room air There were no vitals filed for this visit.   Physical Exam   General Appearance:    Alert, cooperative, no distress  Eyes:    PERRL, conjunctiva/corneas clear, EOM's intact       MS:   Mild tenderness of PIPs and DIPS. No gross deformities. No  erythema. Trigger finger noted. No wrist tenderness.                 Assessment & Plan:     1. Arthralgia, unspecified joint No sign of of auto-immune disorder, likely OA.  - meloxicam (MOBIC) 7.5 MG tablet; Take 1 tablet (7.5 mg total) by mouth daily.  Dispense: 30 tablet; Refill: 0 - diclofenac sodium (VOLTAREN) 1 % GEL; Apply 4 g topically 4 (four) times daily. To shoulder  Dispense: 100 g; Refill: 2       Lelon Huh, MD  [facility]

## 2017-12-15 ENCOUNTER — Other Ambulatory Visit: Payer: Self-pay | Admitting: Physician Assistant

## 2017-12-15 DIAGNOSIS — I10 Essential (primary) hypertension: Secondary | ICD-10-CM

## 2017-12-16 ENCOUNTER — Other Ambulatory Visit: Payer: Self-pay | Admitting: Family Medicine

## 2017-12-16 NOTE — Telephone Encounter (Signed)
[facility]ed a refill request for a 90-days supply for the following medication. Thanks CC  lisinopril (PRINIVIL,ZESTRIL) 40 MG tablet

## 2017-12-16 NOTE — Telephone Encounter (Signed)
Patient already has pended order for refill.

## 2017-12-20 ENCOUNTER — Other Ambulatory Visit: Payer: Self-pay | Admitting: Family Medicine

## 2017-12-20 DIAGNOSIS — M2550 Pain in unspecified joint: Secondary | ICD-10-CM

## 2017-12-27 ENCOUNTER — Other Ambulatory Visit: Payer: Self-pay | Admitting: Family Medicine

## 2017-12-27 DIAGNOSIS — F419 Anxiety disorder, unspecified: Secondary | ICD-10-CM

## 2018-01-08 ENCOUNTER — Encounter: Payer: Self-pay | Admitting: Family Medicine

## 2018-01-08 ENCOUNTER — Ambulatory Visit (INDEPENDENT_AMBULATORY_CARE_PROVIDER_SITE_OTHER): Payer: PPO | Admitting: Family Medicine

## 2018-01-08 VITALS — BP 140/64 | HR 64 | Temp 98.6°F | Resp 16 | Wt 196.0 lb

## 2018-01-08 DIAGNOSIS — N6320 Unspecified lump in the left breast, unspecified quadrant: Secondary | ICD-10-CM | POA: Diagnosis not present

## 2018-01-08 DIAGNOSIS — M2550 Pain in unspecified joint: Secondary | ICD-10-CM | POA: Diagnosis not present

## 2018-01-08 MED ORDER — MELOXICAM 7.5 MG PO TABS: 7.5000 mg | ORAL_TABLET | Freq: Every day | ORAL | 3 refills | Status: DC | PRN

## 2018-01-08 NOTE — Progress Notes (Signed)
Patient: Christy Mills Female    DOB: 05/24/1934   84 y.o.   MRN: [id number] Visit Date: 01/08/2018  Today's Provider: Lelon Huh, MD   Chief Complaint  Patient presents with  . Breast Problem   Subjective:    HPI Breast Lump: Patient comes in reporting that she found a lump the side of her left breast, near her under arm this morning. Patient denies any pain in that area.   Allergies  Allergen Reactions  . Ciprofloxacin Hcl Other (See Comments)    tendinopathy     Current Outpatient Medications:  .  acetaminophen (TYLENOL) 500 MG tablet, Take 500 mg by mouth. , Disp: , Rfl:  .  amLODipine (NORVASC) 5 MG tablet, TAKE 1 TABLET (5 MG TOTAL) BY MOUTH DAILY., Disp: 90 tablet, Rfl: 1 .  aspirin EC 81 MG tablet, Take 162 mg by mouth. , Disp: , Rfl:  .  calcium carbonate (OSCAL) 1500 (600 Ca) MG TABS tablet, Take 600 mg of elemental calcium by mouth 2 (two) times daily with a meal. , Disp: , Rfl:  .  diclofenac sodium (VOLTAREN) 1 % GEL, Apply 4 g topically 4 (four) times daily. To shoulder, Disp: 100 g, Rfl: 2 .  furosemide (LASIX) 20 MG tablet, Take 1 tablet (20 mg total) by mouth daily as needed., Disp: 90 tablet, Rfl: 1 .  lisinopril (PRINIVIL,ZESTRIL) 40 MG tablet, TAKE 1 TABLET (40 MG TOTAL) BY MOUTH DAILY., Disp: 90 tablet, Rfl: 1 .  LORazepam (ATIVAN) 1 MG tablet, TAKE 1 TABLET BY MOUTH EVERY DAY, Disp: 90 tablet, Rfl: 1 .  meloxicam (MOBIC) 7.5 MG tablet, Take 1 tablet (7.5 mg total) by mouth daily as needed., Disp: 30 tablet, Rfl: 5 .  Multiple Vitamins-Minerals (CENTRUM SILVER 50+WOMEN) TABS, Take 1 tablet by mouth daily., Disp: , Rfl:  .  omeprazole (PRILOSEC) 20 MG capsule, Take 1 capsule (20 mg total) by mouth daily., Disp: 90 capsule, Rfl: 1 .  potassium chloride (K-DUR) 10 MEQ tablet, Take 1 tablet (10 mEq total) by mouth daily. On days she takes furosemide, Disp: 90 tablet, Rfl: 1 .  simvastatin (ZOCOR) 20 MG tablet, TAKE 1 TABLET BY MOUTH EVERY DAY, Disp: 90  tablet, Rfl: 1 .  Misc Natural Products (OSTEO BI-FLEX ADV DOUBLE ST PO), Take 1 tablet by mouth daily with lunch., Disp: , Rfl:   Review of Systems  Constitutional: Positive for fatigue. Negative for appetite change, chills and fever.  Respiratory: Negative for chest tightness and shortness of breath.   Cardiovascular: Negative for chest pain and palpitations.  Gastrointestinal: Negative for abdominal pain, nausea and vomiting.  Skin:       Lump on the side of her left breast  Neurological: Negative for dizziness and weakness.    Social History   Tobacco Use  . Smoking status: Former Smoker    Types: Cigarettes    Last attempt to quit: 08/01/1991    Years since quitting: 26.4  . Smokeless tobacco: Never Used  . Tobacco comment: quit in 1992  Substance Use Topics  . Alcohol use: No    Alcohol/week: 0.0 oz   Objective:   BP 140/64 (BP Location: Left Arm, Patient Position: Sitting, Cuff Size: Large)   Pulse 64   Temp 98.6 F (37 C) (Oral)   Resp 16   Wt 196 lb (88.9 kg)   SpO2 95% Comment: room air  BMI 33.64 kg/m  There were no vitals filed for this visit.  Physical Exam  Vague grape sized mass upper outer quadrant left breast, non tender. No erythema.     Assessment & Plan:     1. Breast mass, left  - MM Digital Diagnostic Unilat L; Future - US BREAST COMPLETE UNI LEFT INC AXILLA; Future  2. Arthralgia, unspecified joint She requests refill - meloxicam (MOBIC) 7.5 MG tablet; Take 1 tablet (7.5 mg total) by mouth daily as needed.  Dispense: 90 tablet; Refill: 3       Lelon Huh, MD  [facility]

## 2018-01-09 ENCOUNTER — Telehealth: Payer: Self-pay | Admitting: Family Medicine

## 2018-01-09 DIAGNOSIS — N6320 Unspecified lump in the left breast, unspecified quadrant: Secondary | ICD-10-CM

## 2018-01-09 NOTE — Telephone Encounter (Signed)
Orders placed as stated below. Please schedule.

## 2018-01-09 NOTE — Telephone Encounter (Signed)
Christy Mills states they will need an order for uni left diagnostic mammogram TOMO [id number] and LIMITED left breast ultrasound [id number]

## 2018-01-15 ENCOUNTER — Ambulatory Visit
Admission: RE | Admit: 2018-01-15 | Discharge: 2018-01-15 | Disposition: A | Discharge status: Home / Self Care | Payer: PPO | Source: Ambulatory Visit | Attending: Family Medicine | Admitting: Family Medicine

## 2018-01-15 DIAGNOSIS — N6320 Unspecified lump in the left breast, unspecified quadrant: Secondary | ICD-10-CM

## 2018-01-15 DIAGNOSIS — N6321 Unspecified lump in the left breast, upper outer quadrant: Secondary | ICD-10-CM | POA: Insufficient documentation

## 2018-01-15 DIAGNOSIS — N6489 Other specified disorders of breast: Secondary | ICD-10-CM | POA: Diagnosis not present

## 2018-01-15 DIAGNOSIS — R928 Other abnormal and inconclusive findings on diagnostic imaging of breast: Secondary | ICD-10-CM | POA: Diagnosis not present

## 2018-01-16 ENCOUNTER — Telehealth: Payer: Self-pay

## 2018-01-16 NOTE — Telephone Encounter (Signed)
Tried calling patient. Left message to call back. 

## 2018-01-16 NOTE — Telephone Encounter (Signed)
-----   Message from Birdie Sons, MD sent at 01/16/2018  8:15 AM EDT ----- Mammogram and ultrasound are normal. Lump is probably a cyst. Not treatment need unless it becomes red or painful.

## 2018-01-17 ENCOUNTER — Other Ambulatory Visit: Payer: Self-pay | Admitting: Physician Assistant

## 2018-01-17 DIAGNOSIS — I10 Essential (primary) hypertension: Secondary | ICD-10-CM

## 2018-01-21 NOTE — Telephone Encounter (Signed)
LMOVM for pt to return call 

## 2018-01-22 NOTE — Telephone Encounter (Signed)
Pt returned call. Please advise. Thanks TNP °

## 2018-01-23 NOTE — Telephone Encounter (Signed)
LMOVM for pt to return call 

## 2018-01-24 NOTE — Telephone Encounter (Signed)
Patient advised.

## 2018-01-27 ENCOUNTER — Telehealth: Payer: Self-pay | Admitting: Family Medicine

## 2018-01-27 NOTE — Telephone Encounter (Signed)
No answer left message for patient to return call for appt.

## 2018-01-31 IMAGING — US US RENAL
1 series · 14 of 25 positions shown · non-contrast
Comparison: CT scan of April 29, 2016.

CLINICAL DATA: Follow-up renal lesion.

EXAM:
RENAL / URINARY TRACT ULTRASOUND COMPLETE

[Series 1: us renal · 0.23mm/px · 14 of 60 slices shown]
[im 1/60]
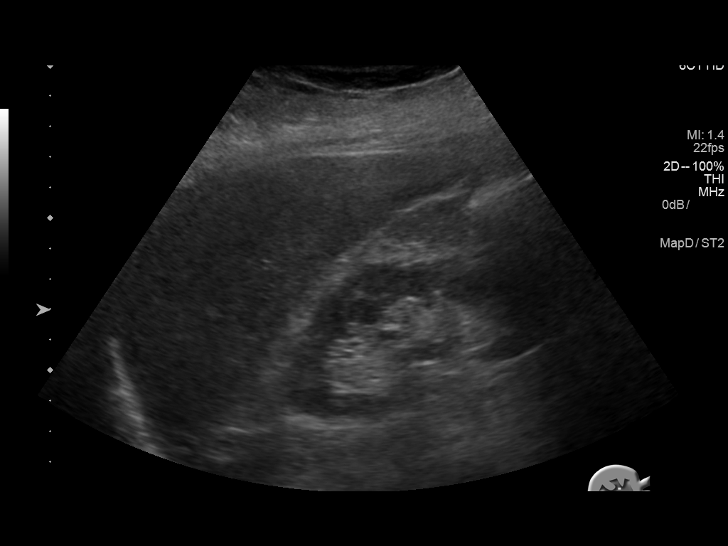
[im 5/60]
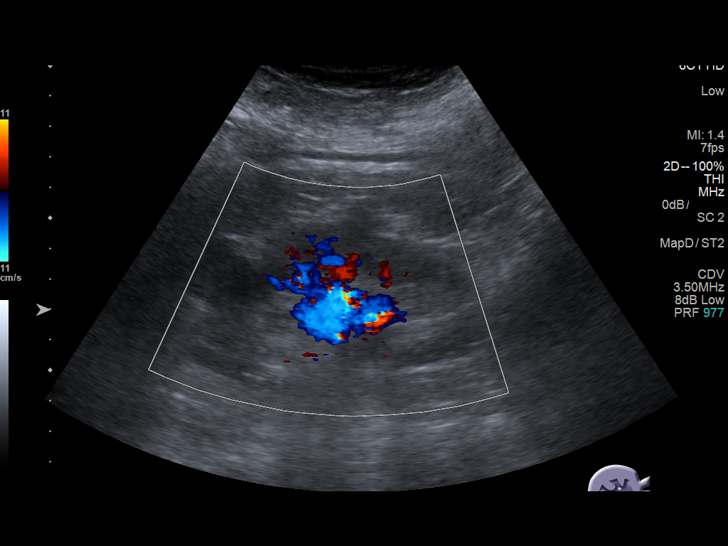
[im 10/60]
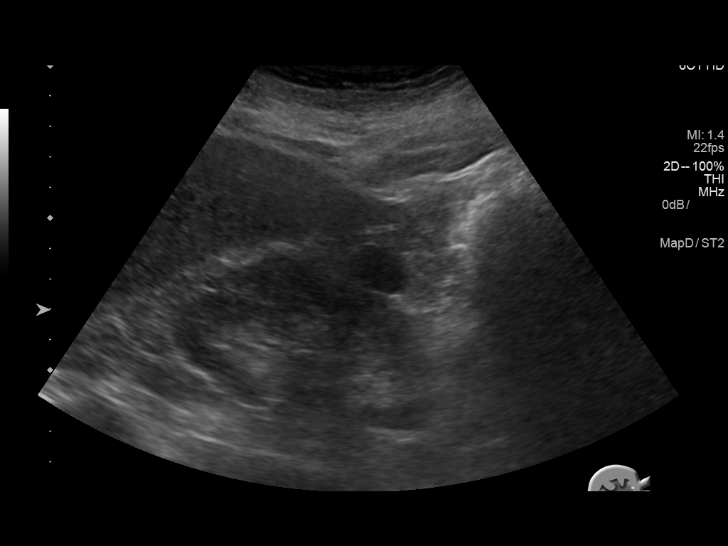
[im 15/60]
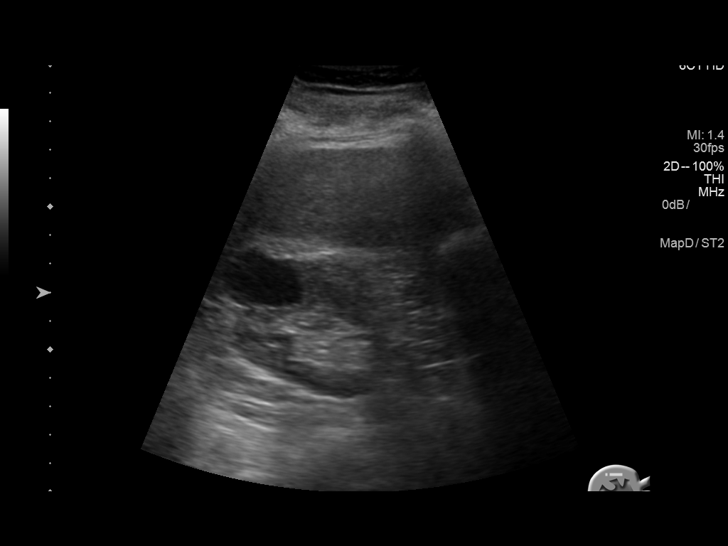
[im 20/60]
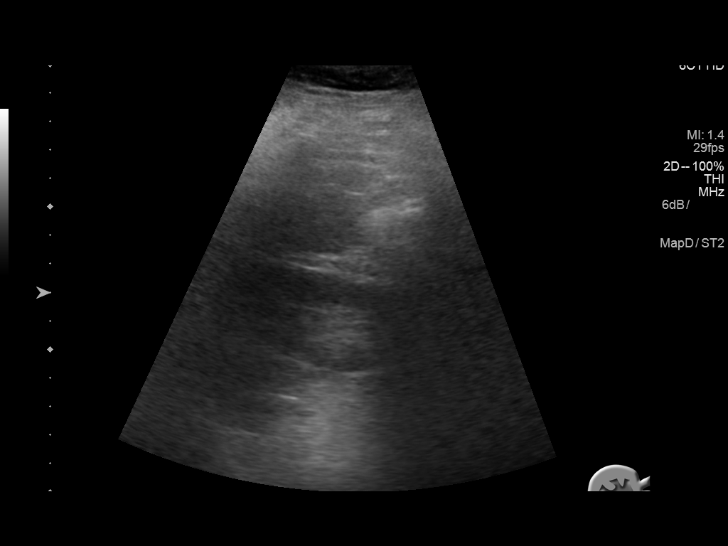
[im 23/60]
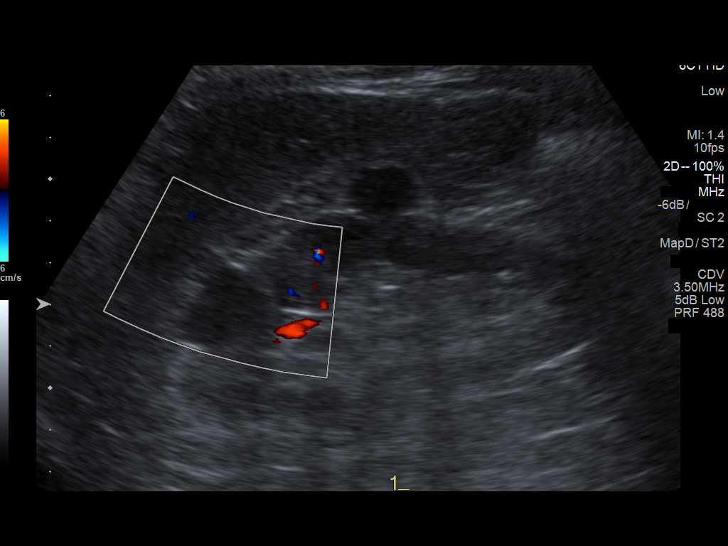
[im 28/60]
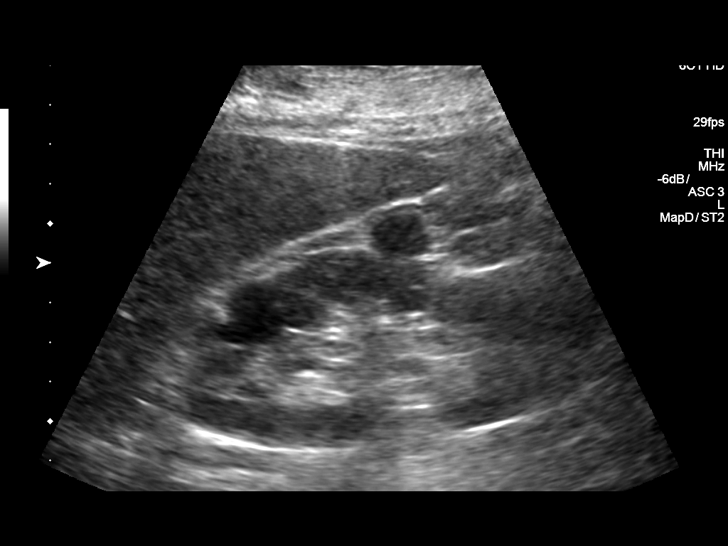
[im 32/60]
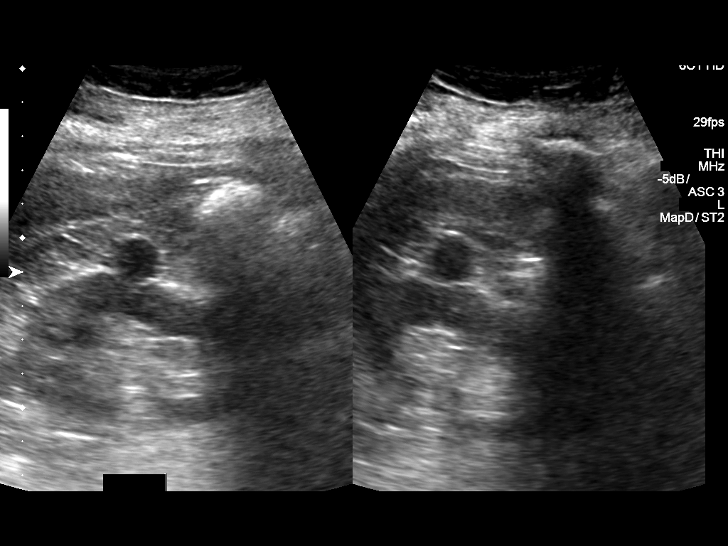
[im 37/60]
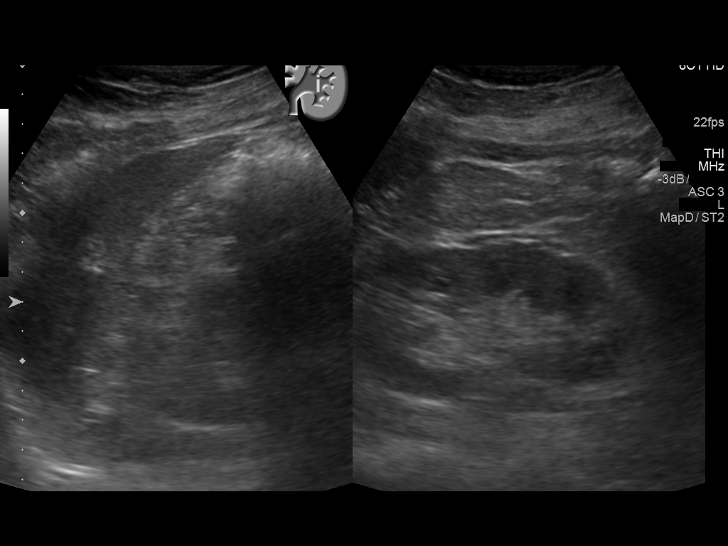
[im 40/60]
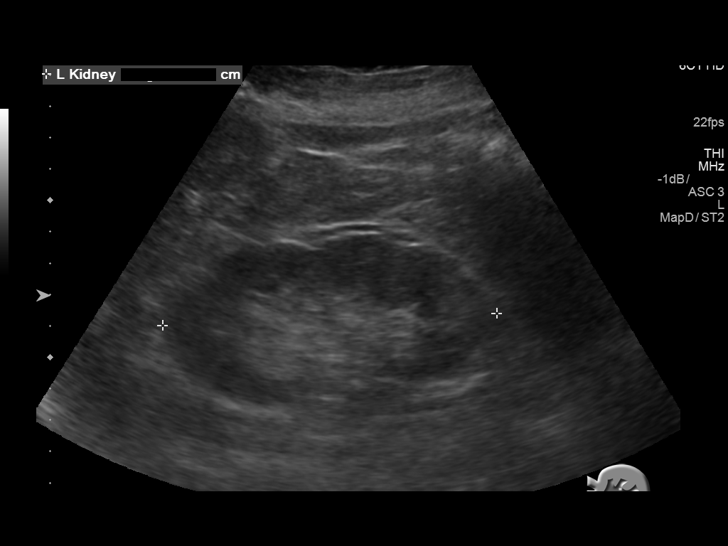
[im 45/60]
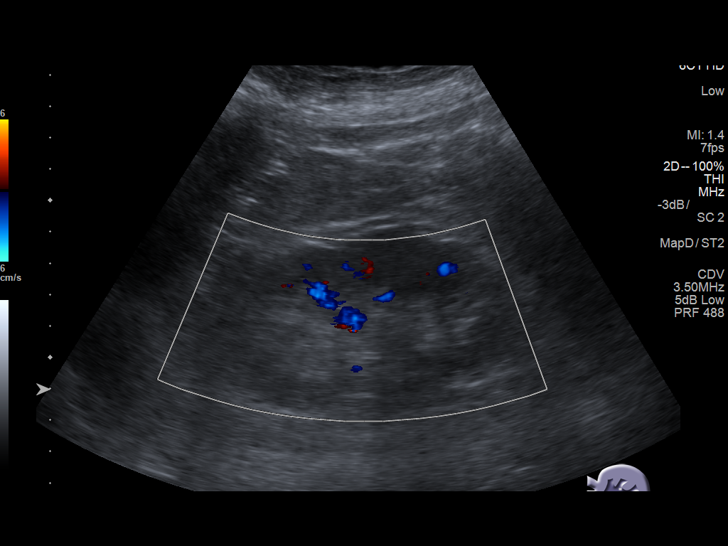
[im 50/60]
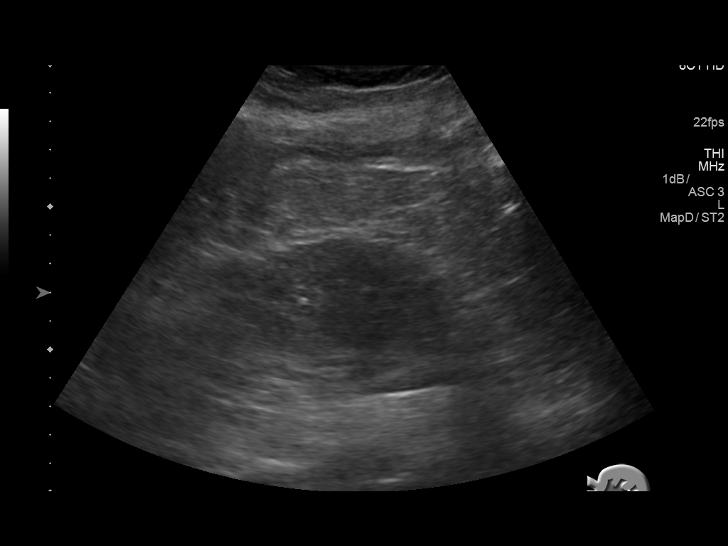
[im 55/60]
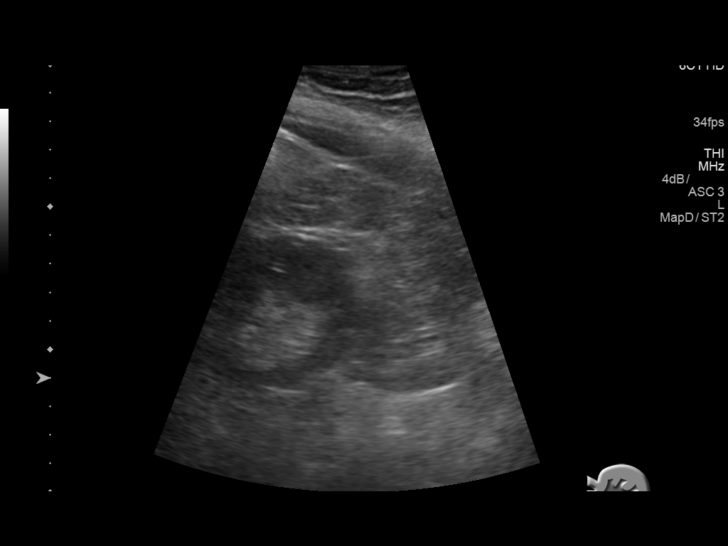
[im 60/60]
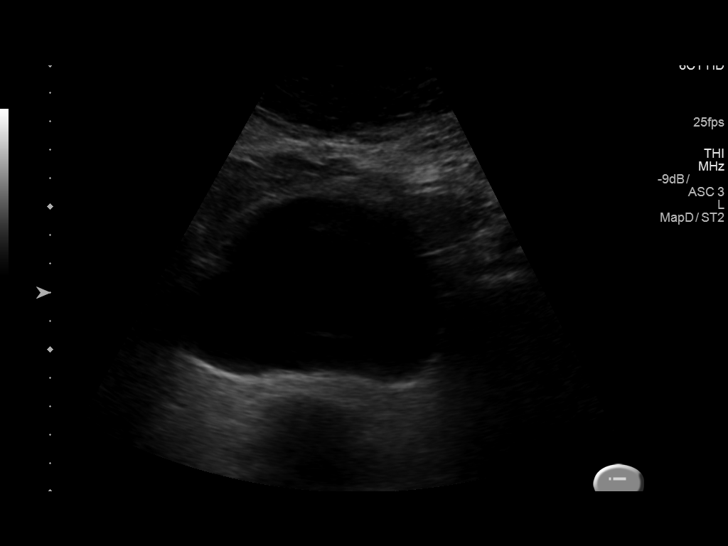

[14 of 25 positions shown; findings below may reference images not displayed]

FINDINGS: Right Kidney:

Length: 10 cm. 2.9 cm simple cyst is noted in upper pole. 1.6 cm
exophytic simple cyst is seen arising from midpole. Echogenicity
within normal limits. No mass or hydronephrosis visualized.

Left Kidney:

Length: 10.7 cm. Echogenicity within normal limits. No mass or
hydronephrosis visualized.

Bladder:

Appears normal for degree of bladder distention.
IMPRESSION: Two simple right renal cysts are noted, 1 of which corresponds to
abnormality seen in midpole of right kidney on prior CT scan. No
other significant abnormality seen.

## 2018-04-15 ENCOUNTER — Ambulatory Visit: Admission: RE | Admit: 2018-04-15 | Payer: PPO | Source: Ambulatory Visit

## 2018-04-23 ENCOUNTER — Other Ambulatory Visit: Payer: Self-pay | Admitting: Family Medicine

## 2018-04-23 DIAGNOSIS — E785 Hyperlipidemia, unspecified: Secondary | ICD-10-CM

## 2018-05-02 ENCOUNTER — Telehealth: Payer: Self-pay

## 2018-05-02 NOTE — Telephone Encounter (Signed)
LMTCB and schedule AWV after 05/31/18. -MM

## 2018-05-02 NOTE — Telephone Encounter (Signed)
Pt states she doesn't want to do an AWV this year.

## 2018-05-06 NOTE — Telephone Encounter (Signed)
Noted, thank you. FYI to PCP! -MM 

## 2018-05-13 ENCOUNTER — Telehealth: Payer: Self-pay | Admitting: Family Medicine

## 2018-05-13 NOTE — Telephone Encounter (Signed)
She can take OTC Delsym and chlortrimeton. O.v. Is any fever, shortness of breath, chest pain, or if cough lasts more than 10 day day.

## 2018-05-13 NOTE — Telephone Encounter (Signed)
Wants to know if you will send something to the pharmacy for her cough she started yesterday  [facility] [address]  CB# [phone]  Thanks [name]

## 2018-05-13 NOTE — Telephone Encounter (Signed)
Please advise 

## 2018-05-13 NOTE — Telephone Encounter (Signed)
Patient was advised. Expressed understanding.  

## 2018-05-22 IMAGING — CT CT ABD-PELV W/ CM
2 of 5 series · 15 of 46 positions shown, 17 images · IV contrast (APPLIED)
Comparison: CT scan 03/30/2005

CLINICAL DATA: Abdominal pain and diarrhea since early this
morning.

EXAM:
CT ABDOMEN AND PELVIS WITH CONTRAST
TECHNIQUE: Multidetector CT imaging of the abdomen and pelvis was performed
using the standard protocol following bolus administration of
intravenous contrast.
CONTRAST:  100mL [id number] IOPAMIDOL ([id number]) INJECTION 61%

[Series 2: axial st · axial · 0.75mm/px · z∈[-1041,-651]mm · 12 of 88 slices shown, 14 images]
[im 5/88  soft-tissue]
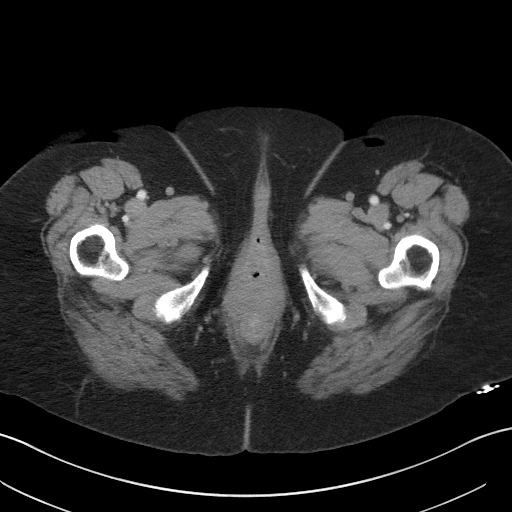
[im 5/88  bone]
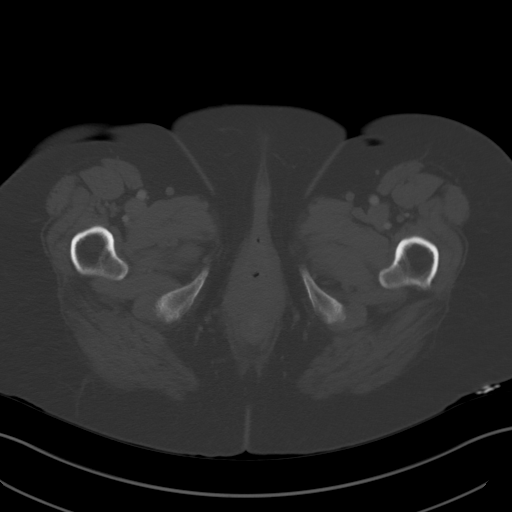
[im 14/88  soft-tissue]
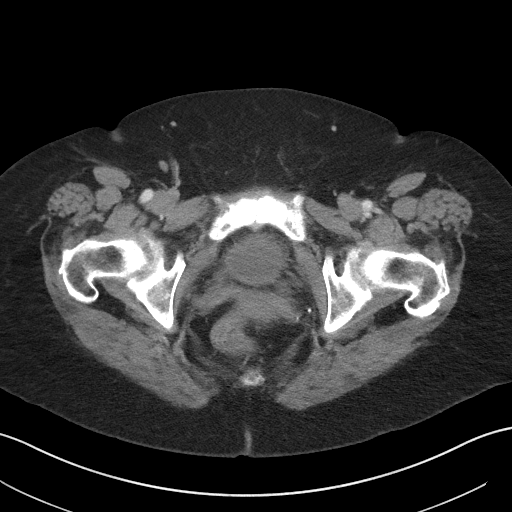
[im 19/88  soft-tissue]
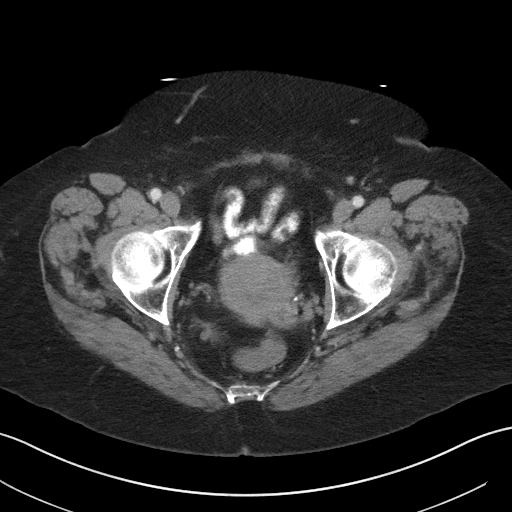
[im 28/88  soft-tissue]
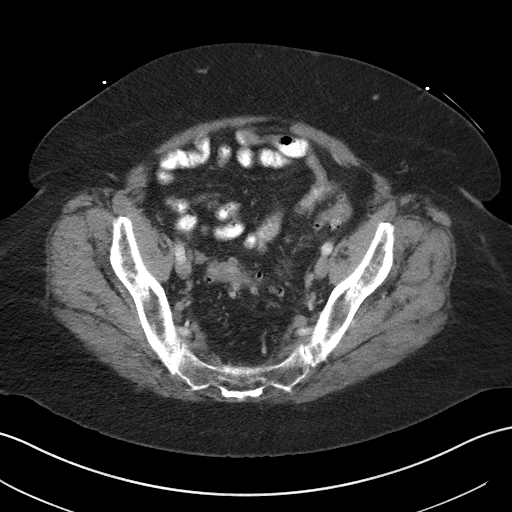
[im 33/88  soft-tissue]
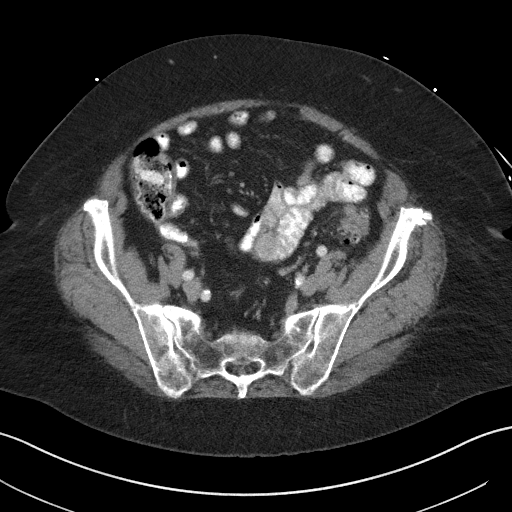
[im 42/88  soft-tissue]
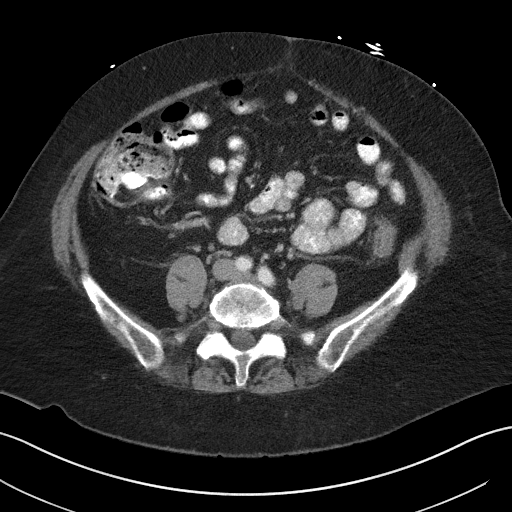
[im 46/88  soft-tissue]
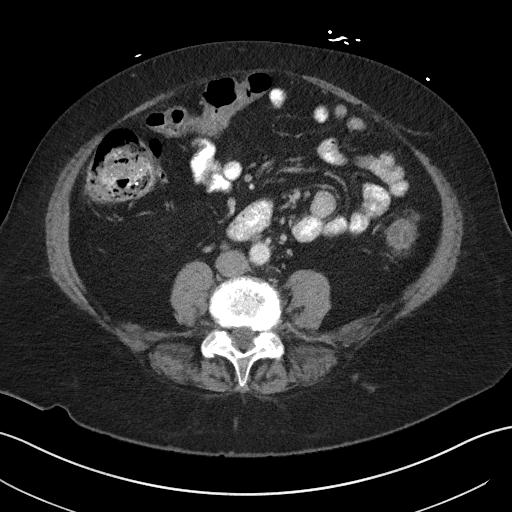
[im 55/88  soft-tissue]
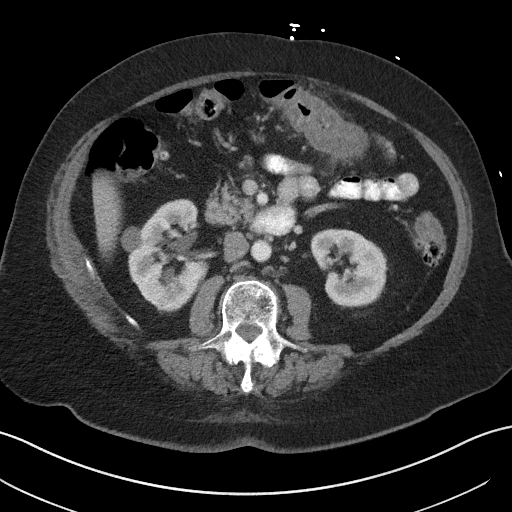
[im 60/88  soft-tissue]
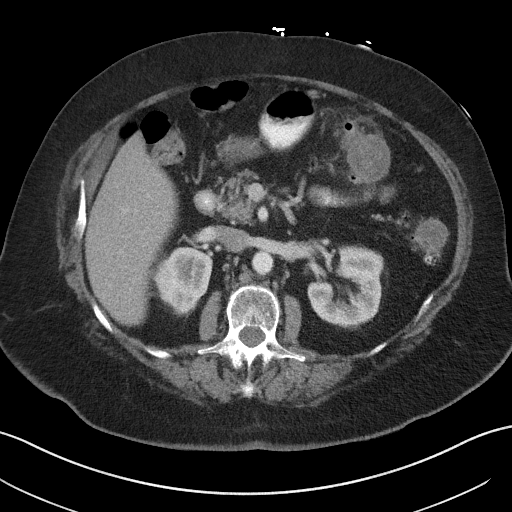
[im 60/88  bone]
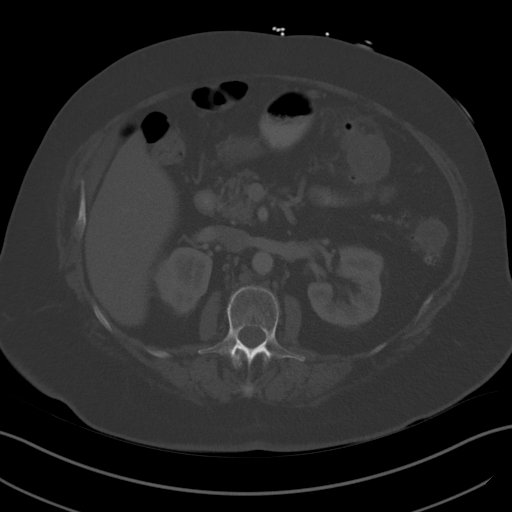
[im 69/88  soft-tissue]
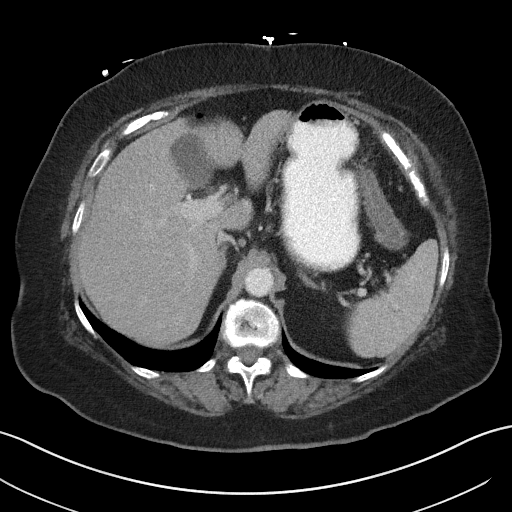
[im 74/88  soft-tissue]
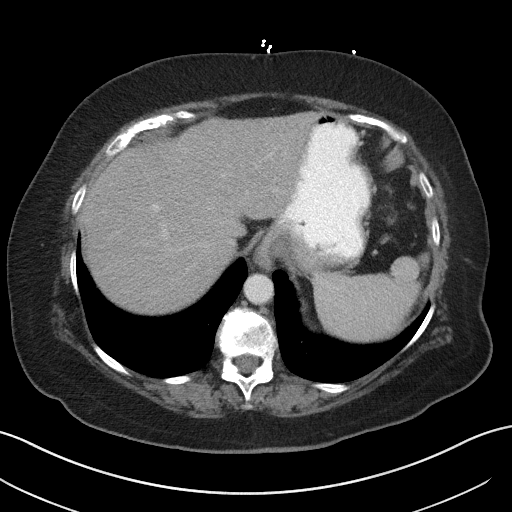
[im 83/88  soft-tissue]
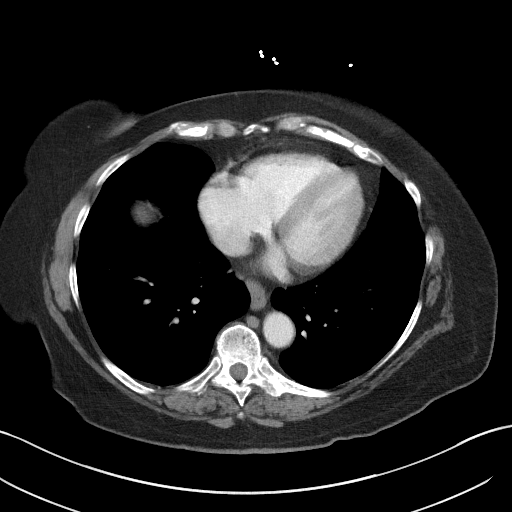

[Series 5: coronal st · coronal · 0.75mm/px · 3 of 114 slices shown]
[im 38/114  soft-tissue]
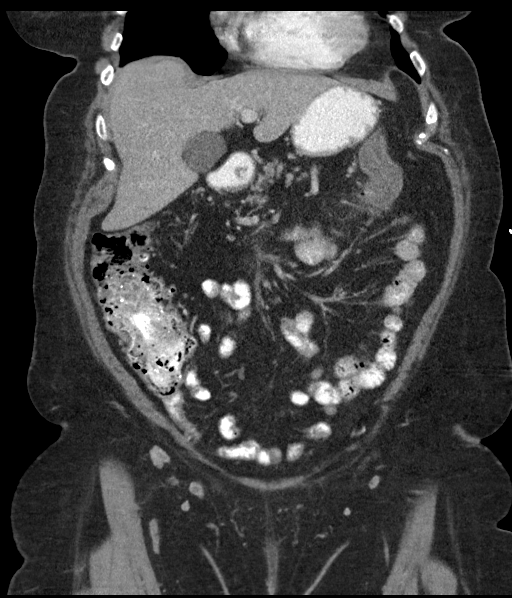
[im 51/114  soft-tissue]
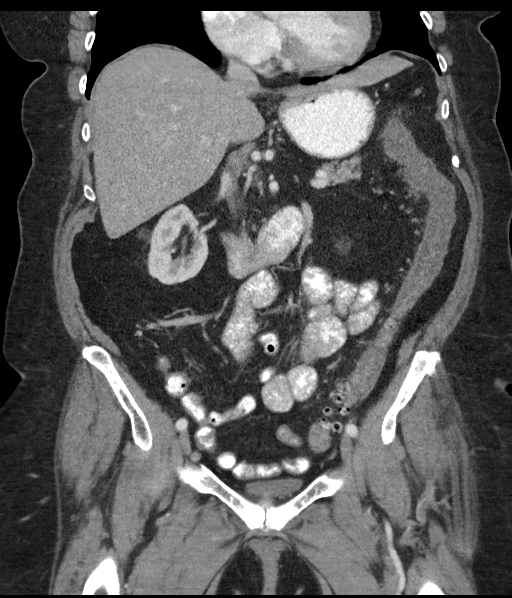
[im 63/114  soft-tissue]
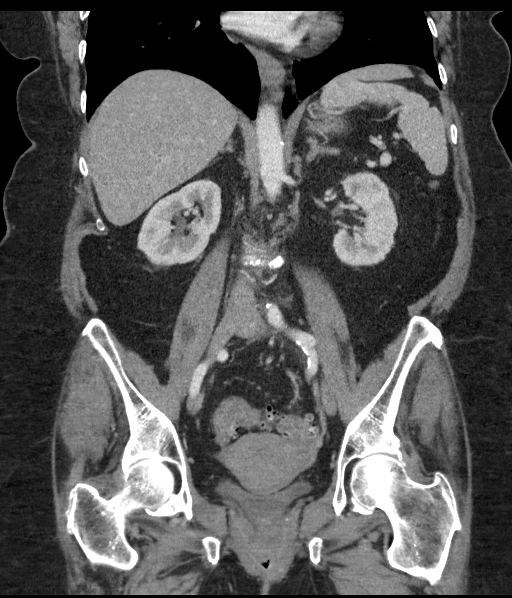

[15 of 46 positions shown; findings below may reference images not displayed]

FINDINGS: Lower chest: The lung bases are clear. No pleural effusion. The
heart is normal in size for age. No pericardial effusion. Small
hiatal hernia.

Hepatobiliary: No focal hepatic lesions or intrahepatic biliary
dilatation. The gallbladder is normal. No common bile duct
dilatation.

Pancreas: Prominent fatty interstices but no mass, inflammation or
ductal dilatation.

Spleen: Normal size.  No focal lesions.

Adrenals/Urinary Tract: The adrenal glands are normal.

There is a simple cyst at the upper pole midpole junction region
laterally. There is also a small, 15 mm low-attenuation lesion
projecting off the lateral cortex of the interpolar region. This
measures 24 Hounsfield units and on delayed images measures 23
Hounsfield units. It is most likely a slightly complex hemorrhagic
or proteinaceous cyst. A followup renal ultrasound would be adequate
in 6 months to reassess this.

Stomach/Bowel: The stomach, duodenum, small bowel and terminal ileum
are unremarkable. No inflammatory changes, mass lesions or
obstructive findings.

There is a significant inflammatory process involving the mid
transverse colon, hepatic flexure and descending colon, most likely
infectious or inflammatory colitis. This is unlikely ischemic
colitis given the fact with the SMA and IMA are patent and also,
there appears to be mucosal and serosal enhancement of the colon in
this area. The No masses identified. Moderate descending colon and
sigmoid colon diverticulosis but no findings for acute
diverticulitis.

Vascular/Lymphatic: The aorta is normal in caliber. Mild to moderate
atherosclerotic calcifications but no aneurysm or dissection. The
branch vessels are patent. Specifically, the SMA and IMA are patent.
The major venous structures are patent. No mesenteric or
retroperitoneal mass or adenopathy. Small scattered lymph nodes are
noted.

Reproductive: The endometrium is thickened for age measuring 13 mm.
Recommend pelvic ultrasound for further evaluation and to exclude
endometrial hyperplasia/ tumor. Small calcified fibroids are noted.
The ovaries are unremarkable.

Other: No free pelvic fluid collections. No pelvic lymphadenopathy.
No inguinal mass or adenopathy. No abdominal wall hernia.

Musculoskeletal: Advanced degenerative changes involving the spine
but no acute bony abnormality.
IMPRESSION: 1. Acute inflammatory or infectious process involving the mid
transverse colon, hepatic flexure and descending colon. Ischemic
colitis is felt to be unlikely as discussed above.
2. Thickened endometrium for age (13 mm). Recommend followup pelvic
ultrasound examination.
3. 15 mm right renal lesion is most likely a complex/hemorrhagic
cyst. Recommend followup renal ultrasound examination in 6 months to
document stability.

## 2018-06-05 ENCOUNTER — Other Ambulatory Visit: Payer: Self-pay | Admitting: Physician Assistant

## 2018-06-05 DIAGNOSIS — I10 Essential (primary) hypertension: Secondary | ICD-10-CM

## 2018-06-16 DIAGNOSIS — M722 Plantar fascial fibromatosis: Secondary | ICD-10-CM | POA: Diagnosis not present

## 2018-06-30 ENCOUNTER — Other Ambulatory Visit: Payer: Self-pay | Admitting: Family Medicine

## 2018-06-30 DIAGNOSIS — F419 Anxiety disorder, unspecified: Secondary | ICD-10-CM

## 2018-07-07 DIAGNOSIS — M722 Plantar fascial fibromatosis: Secondary | ICD-10-CM | POA: Diagnosis not present

## 2018-07-24 ENCOUNTER — Other Ambulatory Visit: Payer: Self-pay | Admitting: Otolaryngology

## 2018-07-24 DIAGNOSIS — E041 Nontoxic single thyroid nodule: Secondary | ICD-10-CM

## 2018-07-25 ENCOUNTER — Other Ambulatory Visit: Payer: Self-pay | Admitting: Physician Assistant

## 2018-07-25 DIAGNOSIS — K219 Gastro-esophageal reflux disease without esophagitis: Secondary | ICD-10-CM

## 2018-07-25 NOTE — Telephone Encounter (Signed)
See refill request.

## 2018-08-05 ENCOUNTER — Ambulatory Visit
Admission: RE | Admit: 2018-08-05 | Discharge: 2018-08-05 | Disposition: A | Discharge status: Home / Self Care | Payer: PPO | Source: Ambulatory Visit | Attending: Otolaryngology | Admitting: Otolaryngology

## 2018-08-05 DIAGNOSIS — E042 Nontoxic multinodular goiter: Secondary | ICD-10-CM | POA: Insufficient documentation

## 2018-08-05 DIAGNOSIS — E041 Nontoxic single thyroid nodule: Secondary | ICD-10-CM

## 2018-08-20 DIAGNOSIS — Z85828 Personal history of other malignant neoplasm of skin: Secondary | ICD-10-CM | POA: Diagnosis not present

## 2018-08-20 DIAGNOSIS — D485 Neoplasm of uncertain behavior of skin: Secondary | ICD-10-CM | POA: Diagnosis not present

## 2018-08-20 DIAGNOSIS — C4401 Basal cell carcinoma of skin of lip: Secondary | ICD-10-CM | POA: Diagnosis not present

## 2018-08-20 DIAGNOSIS — L578 Other skin changes due to chronic exposure to nonionizing radiation: Secondary | ICD-10-CM | POA: Diagnosis not present

## 2018-08-25 ENCOUNTER — Telehealth: Payer: Self-pay | Admitting: Family Medicine

## 2018-08-25 DIAGNOSIS — E041 Nontoxic single thyroid nodule: Secondary | ICD-10-CM

## 2018-08-25 NOTE — Telephone Encounter (Signed)
Dr. Kathyrn Sheriff - ENT - [phone]  Ext [phone]    Needing labs added:  TSH and Free T3 and T4  Please advise.  Thanks, [employer]

## 2018-08-26 NOTE — Telephone Encounter (Signed)
I don't know anything about this or why the order is being requested. Need more information.

## 2018-08-27 NOTE — Telephone Encounter (Signed)
I called and spoke with Dr. Maisie Fus nurse Otila Kluver. Otila Kluver states that Dr. Caryn Section referred patient to Dr. Gregary Signs last year for thyroid nodule. Patient was only seen by Dr. Ayesha Mohair once back in 03/2017. Dr. Gregary Signs performed a fine needle biopsy and the results came back as a benign Goiter. Dr. Gregary Signs wanted patient to have a thyroid ultrasound and labs rechecked yearly. Patient has had the thyroid ultrasound done (and the results were fine). Otila Kluver says that she has told patient that they need to check her labs. Otila Kluver states that the patient doesn't want to come back in for them to check her labs and get stuck. Patient asked Otila Kluver if she could have Dr. Caryn Section add these test (TSH, Free T3 and T4) onto the labs he orders when she comes in for her physical. I looked in patients chart, and she does not have any future appointments scheduled. I saw where Alyson Ingles tried reaching out to the patient on 05/02/2018, and it was documented that patient doesn't want to do an AWV this year. Please advise.

## 2018-08-27 NOTE — Telephone Encounter (Signed)
That's fine. Order has been signed. Please leave at lab and advise patient. Thanks.

## 2018-09-01 NOTE — Telephone Encounter (Signed)
Patient agrees to have lab work done. She requested an appointment to follow up with Dr. Caryn Section this week. I scheduled patient to come in on Wednesday 09/03/2018 at 11am. Patient wants to wait until her appointment to have labs drawn. Should I hold off on signing this order for thyroid labs, in case you need to order more labs during the office visit?

## 2018-09-01 NOTE — Telephone Encounter (Signed)
Tried calling patient. Left message to call back. 

## 2018-09-02 DIAGNOSIS — C4401 Basal cell carcinoma of skin of lip: Secondary | ICD-10-CM | POA: Diagnosis not present

## 2018-09-03 ENCOUNTER — Ambulatory Visit (INDEPENDENT_AMBULATORY_CARE_PROVIDER_SITE_OTHER): Payer: PPO | Admitting: Family Medicine

## 2018-09-03 ENCOUNTER — Encounter: Payer: Self-pay | Admitting: Family Medicine

## 2018-09-03 VITALS — BP 134/68 | HR 68 | Temp 97.8°F | Resp 16 | Wt 205.0 lb

## 2018-09-03 DIAGNOSIS — E041 Nontoxic single thyroid nodule: Secondary | ICD-10-CM

## 2018-09-03 DIAGNOSIS — I10 Essential (primary) hypertension: Secondary | ICD-10-CM | POA: Diagnosis not present

## 2018-09-03 DIAGNOSIS — M810 Age-related osteoporosis without current pathological fracture: Secondary | ICD-10-CM | POA: Diagnosis not present

## 2018-09-03 DIAGNOSIS — E7800 Pure hypercholesterolemia, unspecified: Secondary | ICD-10-CM | POA: Diagnosis not present

## 2018-09-03 NOTE — Progress Notes (Signed)
Patient: Christy Mills Female    DOB: 08/12/1934   84 y.o.   MRN: [id number] Visit Date: 09/03/2018  Today's Provider: Lelon Huh, MD   No chief complaint on file.  Subjective:    Hypertension  This is a chronic problem. The problem is unchanged. The problem is controlled. Associated symptoms include anxiety and malaise/fatigue. Pertinent negatives include no blurred vision, chest pain, headaches, orthopnea, palpitations, peripheral edema or shortness of breath. There are no associated agents to hypertension. There are no compliance problems.  Identifiable causes of hypertension include a thyroid problem.  Hyperlipidemia  This is a chronic problem. The problem is controlled. Recent lipid tests were reviewed and are normal. Pertinent negatives include no chest pain, focal weakness, leg pain, myalgias or shortness of breath. Current antihyperlipidemic treatment includes statins. There are no compliance problems.   Anxiety  Presents for follow-up visit. Symptoms include excessive worry and nervous/anxious behavior. Patient reports no chest pain, confusion, decreased concentration, depressed mood, dizziness, insomnia, nausea, palpitations, shortness of breath or suicidal ideas. The quality of sleep is good.    Thyroid Problem  Presents for follow-up visit. Symptoms include anxiety, constipation, diarrhea, fatigue and heat intolerance. Patient reports no cold intolerance, depressed mood, diaphoresis, hair loss, hoarse voice, leg swelling, nail problem or palpitations. The symptoms have been stable. Her past medical history is significant for hyperlipidemia.  She is being followed by Dr. Kathyrn Sheriff for thyroid nodule \\who  has requested that we check thyroid studies since patient does not want to drive to [facility] for follow up. Lesion was biopsied 03-2017 and c/w benign follicular nodule and had follow up u/s 08-05-2018 with no significant change.  Lab Results  Component Value Date   CHOL 165  11/28/2016   CHOL 151 12/21/2015   Lab Results  Component Value Date   HDL 42 11/28/2016   HDL 41 12/21/2015   Lab Results  Component Value Date   LDLCALC 92 11/28/2016   LDLCALC 85 12/21/2015   Lab Results  Component Value Date   TRIG 154 (H) 11/28/2016   TRIG 125 12/21/2015   Lab Results  Component Value Date   CHOLHDL 3.9 11/28/2016   No results found for: LDLDIRECT BP Readings from Last 3 Encounters:  09/03/18 134/68  01/08/18 140/64  12/06/17 122/60   Lab Results  Component Value Date   TSH 1.050 03/08/2017   T4TOTAL 8.4 03/08/2017   Wt Readings from Last 3 Encounters:  09/03/18 205 lb (93 kg)  01/08/18 196 lb (88.9 kg)  05/31/17 203 lb 6.4 oz (92.3 kg)     Follow up osteoporosis.  Was previously treated with alendronate which patient states she stopped taking several years ago due to GI side effects. Last BMD in 2015showed osteopenia in right hip and normal BMD in spine.   Follow up arthritis.  She reports she continues to take meloxicam every day for arthritis pain, which she finds to be very effective and is tolerating well.     Allergies  Allergen Reactions  . Ciprofloxacin Hcl Other (See Comments)    tendinopathy     Current Outpatient Medications:  .  acetaminophen (TYLENOL) 500 MG tablet, Take 500 mg by mouth. , Disp: , Rfl:  .  amLODipine (NORVASC) 5 MG tablet, TAKE 1 TABLET (5 MG TOTAL) BY MOUTH DAILY., Disp: 90 tablet, Rfl: 3 .  aspirin EC 81 MG tablet, Take 162 mg by mouth. , Disp: , Rfl:  .  calcium carbonate (OSCAL) 1500 (  600 Ca) MG TABS tablet, Take 600 mg of elemental calcium by mouth 2 (two) times daily with a meal. , Disp: , Rfl:  .  lisinopril (PRINIVIL,ZESTRIL) 40 MG tablet, TAKE 1 TABLET (40 MG TOTAL) BY MOUTH DAILY., Disp: 90 tablet, Rfl: 1 .  LORazepam (ATIVAN) 1 MG tablet, TAKE 1 TABLET BY MOUTH EVERY DAY, Disp: 90 tablet, Rfl: 4 .  meloxicam (MOBIC) 7.5 MG tablet, Take 1 tablet (7.5 mg total) by mouth daily as needed., Disp: 90  tablet, Rfl: 3 .  Multiple Vitamins-Minerals (CENTRUM SILVER 50+WOMEN) TABS, Take 1 tablet by mouth daily., Disp: , Rfl:  .  omeprazole (PRILOSEC) 20 MG capsule, TAKE 1 CAPSULE BY MOUTH EVERY DAY, Disp: 90 capsule, Rfl: 2 .  simvastatin (ZOCOR) 20 MG tablet, TAKE 1 TABLET BY MOUTH EVERY DAY, Disp: 90 tablet, Rfl: 3 .  diclofenac sodium (VOLTAREN) 1 % GEL, Apply 4 g topically 4 (four) times daily. To shoulder (Patient not taking: Reported on 09/03/2018), Disp: 100 g, Rfl: 2 .  furosemide (LASIX) 20 MG tablet, Take 1 tablet (20 mg total) by mouth daily as needed. (Patient not taking: Reported on 09/03/2018), Disp: 90 tablet, Rfl: 1 .  Misc Natural Products (OSTEO BI-FLEX ADV DOUBLE ST PO), Take 1 tablet by mouth daily with lunch., Disp: , Rfl:  .  potassium chloride (K-DUR) 10 MEQ tablet, Take 1 tablet (10 mEq total) by mouth daily. On days she takes furosemide (Patient not taking: Reported on 09/03/2018), Disp: 90 tablet, Rfl: 1  Review of Systems  Constitutional: Positive for fatigue and malaise/fatigue. Negative for activity change, appetite change, chills, diaphoresis, fever and unexpected weight change.  HENT: Negative.  Negative for hoarse voice.   Eyes: Negative for blurred vision.  Respiratory: Negative.  Negative for shortness of breath.   Cardiovascular: Negative for chest pain, palpitations and orthopnea.  Gastrointestinal: Positive for constipation and diarrhea. Negative for abdominal distention, abdominal pain, anal bleeding, blood in stool, nausea, rectal pain and vomiting.  Endocrine: Positive for heat intolerance. Negative for cold intolerance, polydipsia and polyphagia.  Musculoskeletal: Negative for myalgias.  Skin: Negative for color change, pallor, rash and wound.  Neurological: Positive for light-headedness (Occasionally). Negative for dizziness, focal weakness, syncope and headaches.  Psychiatric/Behavioral: Negative for agitation, behavioral problems, confusion, decreased  concentration, dysphoric mood, hallucinations, self-injury, sleep disturbance and suicidal ideas. The patient is nervous/anxious. The patient does not have insomnia and is not hyperactive.     Social History   Tobacco Use  . Smoking status: Former Smoker    Types: Cigarettes    Last attempt to quit: 08/01/1991    Years since quitting: 27.1  . Smokeless tobacco: Never Used  . Tobacco comment: quit in 1992  Substance Use Topics  . Alcohol use: No    Alcohol/week: 0.0 standard drinks   Objective:   BP 134/68 (BP Location: Right Arm, Patient Position: Sitting, Cuff Size: Large)   Pulse 68   Temp 97.8 F (36.6 C) (Oral)   Resp 16   Wt 205 lb (93 kg)   BMI 35.19 kg/m   Physical Exam   General Appearance:    Alert, cooperative, no distress  Eyes:    PERRL, conjunctiva/corneas clear, EOM's intact       Lungs:     Clear to auscultation bilaterally, respirations unlabored  Heart:    Regular rate and rhythm  Neurologic:   Awake, alert, oriented x 3. No apparent focal neurological  defect.         Assessment & Plan:     1. Essential hypertension Well controlled.  Continue current medications.   - Comprehensive metabolic panel  2. Thyroid nodule Stable u's followed by Dr. Kathyrn Sheriff, check thyroid functions and will send copy to his offive.  - TSH+T4F+T3Free  3. Pure hypercholesterolemia She is tolerating simvastatin well with no adverse effects.   - CBC - Comprehensive metabolic panel - Lipid panel  4. Osteoporosis without current pathological fracture, unspecified osteoporosis type Off alendronate for several years due to GI effects. Due for follow up BMD. Counseled on alternative treatments such as Prolia.  - DG Bone Density; Future - VITAMIN D 25 Hydroxy (Vit-D Deficiency, Fractures)       Lelon Huh, MD  [facility]

## 2018-09-04 ENCOUNTER — Encounter: Payer: Self-pay | Admitting: Family Medicine

## 2018-09-04 ENCOUNTER — Telehealth: Payer: Self-pay

## 2018-09-04 LAB — COMPREHENSIVE METABOLIC PANEL
ALBUMIN: 3.9 g/dL (ref 3.5–4.7)
ALT: 14 IU/L (ref 0–32)
AST: 18 IU/L (ref 0–40)
Albumin/Globulin Ratio: 1.5 (ref 1.2–2.2)
Alkaline Phosphatase: 94 IU/L (ref 39–117)
BUN/Creatinine Ratio: 25 (ref 12–28)
BUN: 17 mg/dL (ref 8–27)
Bilirubin Total: 0.4 mg/dL (ref 0.0–1.2)
CO2: 20 mmol/L (ref 20–29)
Calcium: 9.2 mg/dL (ref 8.7–10.3)
Chloride: 102 mmol/L (ref 96–106)
Creatinine, Ser: 0.68 mg/dL (ref 0.57–1.00)
GFR calc Af Amer: 94 mL/min/{1.73_m2} (ref >59)
GFR calc non Af Amer: 82 mL/min/{1.73_m2} (ref >59)
GLUCOSE: 91 mg/dL (ref 65–99)
Globulin, Total: 2.6 g/dL (ref 1.5–4.5)
Potassium: 4 mmol/L (ref 3.5–5.2)
Sodium: 140 mmol/L (ref 134–144)
Total Protein: 6.5 g/dL (ref 6.0–8.5)

## 2018-09-04 LAB — CBC
HEMOGLOBIN: 15 g/dL (ref 11.1–15.9)
Hematocrit: 44.3 % (ref 34.0–46.6)
MCH: 30.5 pg (ref 26.6–33.0)
MCHC: 33.9 g/dL (ref 31.5–35.7)
MCV: 90 fL (ref 79–97)
Platelets: 226 10*3/uL (ref 150–450)
RBC: 4.91 x10E6/uL (ref 3.77–5.28)
RDW: 12.6 % (ref 12.3–15.4)
WBC: 6.2 10*3/uL (ref 3.4–10.8)

## 2018-09-04 LAB — LIPID PANEL
Chol/HDL Ratio: 3.5 ratio (ref 0.0–4.4)
Cholesterol, Total: 161 mg/dL (ref 100–199)
HDL: 46 mg/dL (ref >39)
LDL Calculated: 89 mg/dL (ref 0–99)
Triglycerides: 132 mg/dL (ref 0–149)
VLDL Cholesterol Cal: 26 mg/dL (ref 5–40)

## 2018-09-04 LAB — TSH+T4F+T3FREE
Free T4: 1.27 ng/dL (ref 0.82–1.77)
T3, Free: 2.9 pg/mL (ref 2.0–4.4)
TSH: 1.08 u[IU]/mL (ref 0.450–4.500)

## 2018-09-04 LAB — VITAMIN D 25 HYDROXY (VIT D DEFICIENCY, FRACTURES): VIT D 25 HYDROXY: 36.8 ng/mL (ref 30.0–100.0)

## 2018-09-04 NOTE — Telephone Encounter (Signed)
LMTCB 09/04/2018  Thanks,   -Mickel Baas

## 2018-09-04 NOTE — Telephone Encounter (Signed)
Pt advised.  Labs faxed to Dr. Kathyrn Sheriff.  Thanks,   -Mickel Baas

## 2018-09-04 NOTE — Telephone Encounter (Signed)
-----   Message from Birdie Sons, MD sent at 09/04/2018 12:21 PM EST ----- Labs are all very good. Continue current medications.  Please send copy of thyroid tests to Dr. Maisie Fus office.  Check labs yearly.

## 2018-09-10 ENCOUNTER — Telehealth: Payer: Self-pay | Admitting: Family Medicine

## 2018-09-10 ENCOUNTER — Ambulatory Visit: Payer: PPO | Admitting: Radiation Oncology

## 2018-09-10 NOTE — Telephone Encounter (Signed)
FYI--Appointment was scheduled for patient to have her bone density 10/28/18 but has been cancelled at pt's request.She states that she will be having mohs surgery and will contact [place] to reschedule when she is up to going

## 2018-10-24 DIAGNOSIS — C4401 Basal cell carcinoma of skin of lip: Secondary | ICD-10-CM | POA: Diagnosis not present

## 2018-10-24 DIAGNOSIS — L814 Other melanin hyperpigmentation: Secondary | ICD-10-CM | POA: Diagnosis not present

## 2018-10-24 DIAGNOSIS — L578 Other skin changes due to chronic exposure to nonionizing radiation: Secondary | ICD-10-CM | POA: Diagnosis not present

## 2018-10-24 DIAGNOSIS — L988 Other specified disorders of the skin and subcutaneous tissue: Secondary | ICD-10-CM | POA: Diagnosis not present

## 2018-10-24 HISTORY — PX: MOHS SURGERY: SHX181

## 2018-10-28 ENCOUNTER — Other Ambulatory Visit: Payer: PPO

## 2018-11-05 ENCOUNTER — Encounter: Payer: Self-pay | Admitting: Family Medicine

## 2018-11-19 DIAGNOSIS — L578 Other skin changes due to chronic exposure to nonionizing radiation: Secondary | ICD-10-CM | POA: Diagnosis not present

## 2018-11-19 DIAGNOSIS — L720 Epidermal cyst: Secondary | ICD-10-CM | POA: Diagnosis not present

## 2018-11-19 DIAGNOSIS — D692 Other nonthrombocytopenic purpura: Secondary | ICD-10-CM | POA: Diagnosis not present

## 2018-11-19 DIAGNOSIS — Z85828 Personal history of other malignant neoplasm of skin: Secondary | ICD-10-CM | POA: Diagnosis not present

## 2018-12-10 ENCOUNTER — Other Ambulatory Visit: Payer: Self-pay | Admitting: Physician Assistant

## 2018-12-10 DIAGNOSIS — I10 Essential (primary) hypertension: Secondary | ICD-10-CM

## 2018-12-15 IMAGING — US US THYROID BIOPSY
1 series · 13 of 21 positions shown · non-contrast
Comparison: Thyroid ultrasound - 03/01/2017

MEDICATIONS:
None

COMPLICATIONS:
None immediate.

INDICATION: Indeterminate thyroid nodule

EXAM:
ULTRASOUND GUIDED FINE NEEDLE ASPIRATION OF INDETERMINATE THYROID
NODULE
TECHNIQUE: Informed written consent was obtained from the patient after a
discussion of the risks, benefits and alternatives to treatment.
Questions regarding the procedure were encouraged and answered. A
timeout was performed prior to the initiation of the procedure.

[Series 1: us thyroid biopsy · 0.06mm/px · 21 acquisitions, 13 frames shown]
[im 1/21]
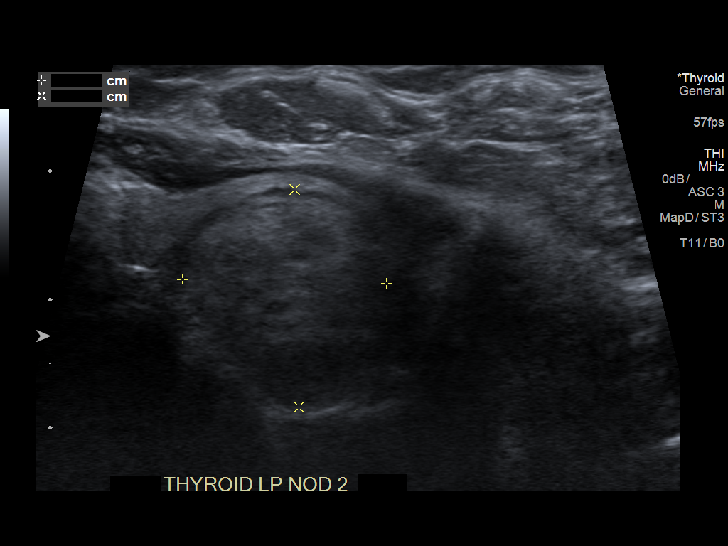
[im 3/21]
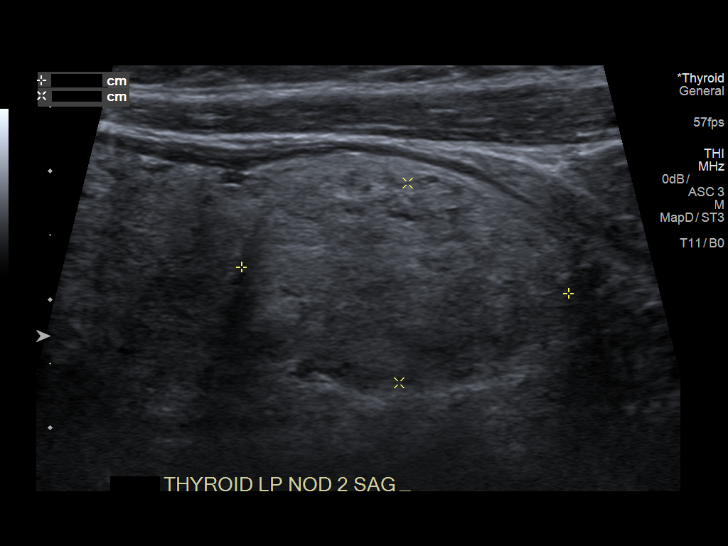
[im 5/21]
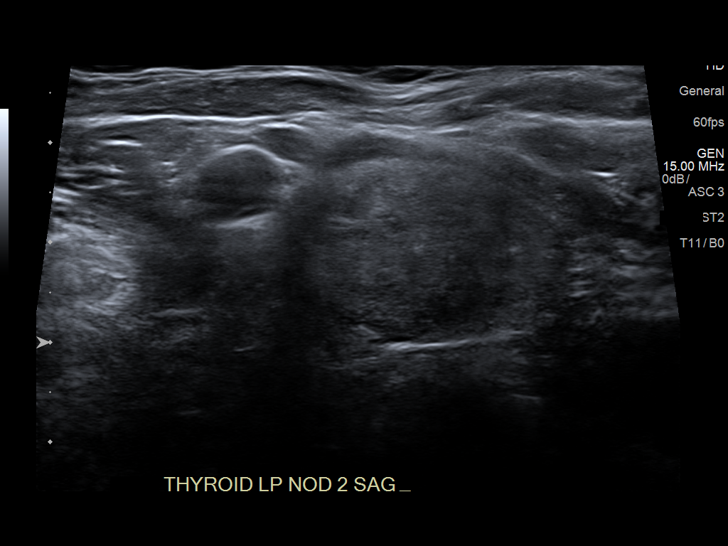
[im 6/21]
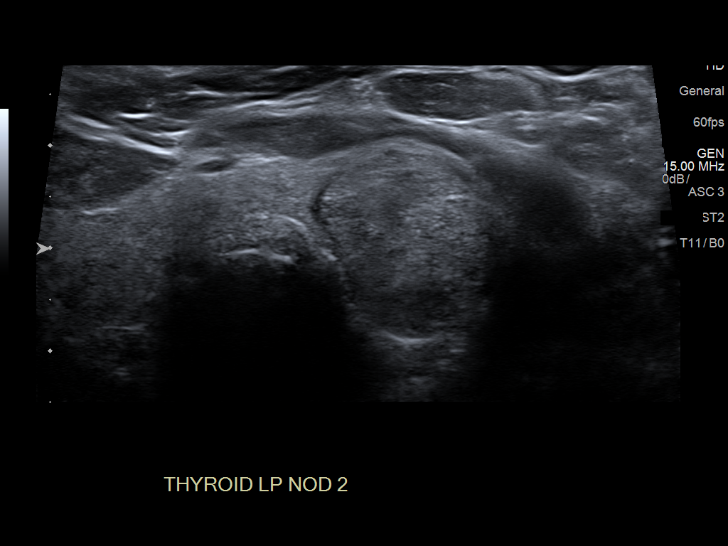
[im 8/21]
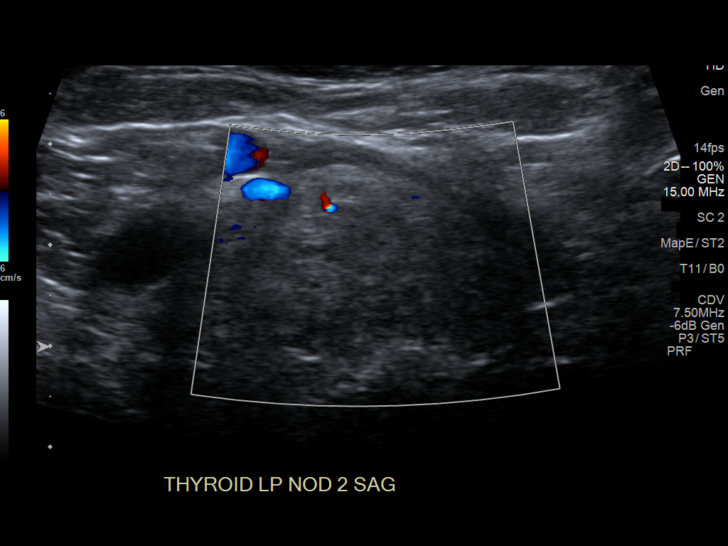
[im 9/21]
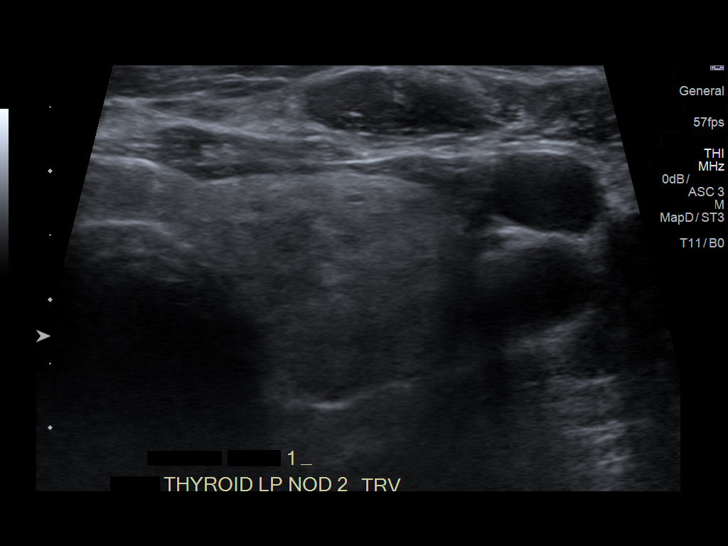
[im 11/21]
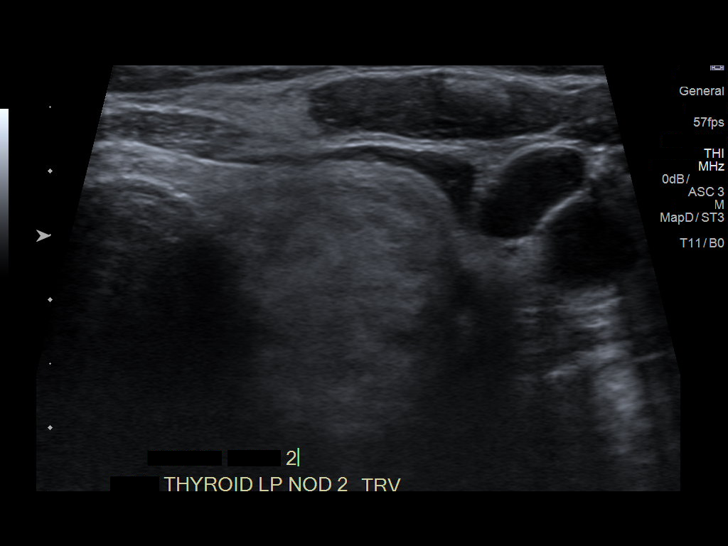
[im 13/21]
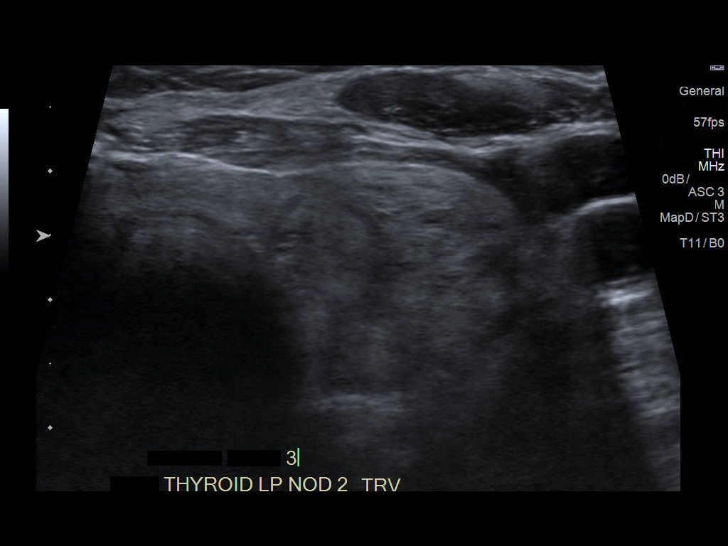
[im 14/21]
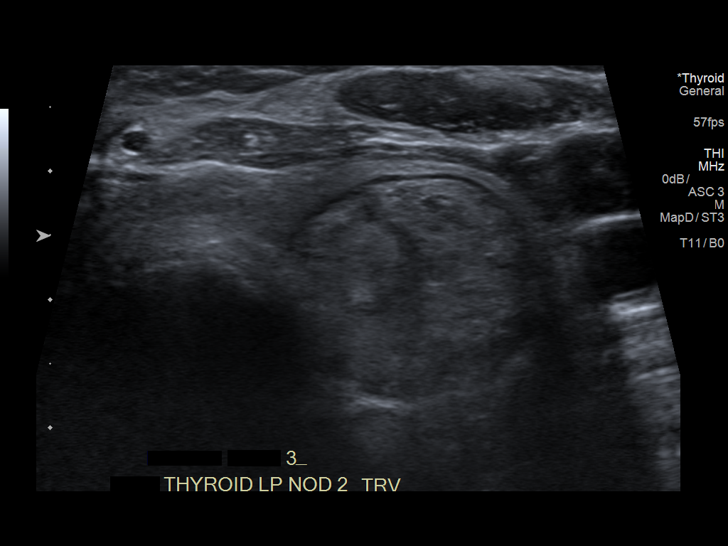
[im 16/21]
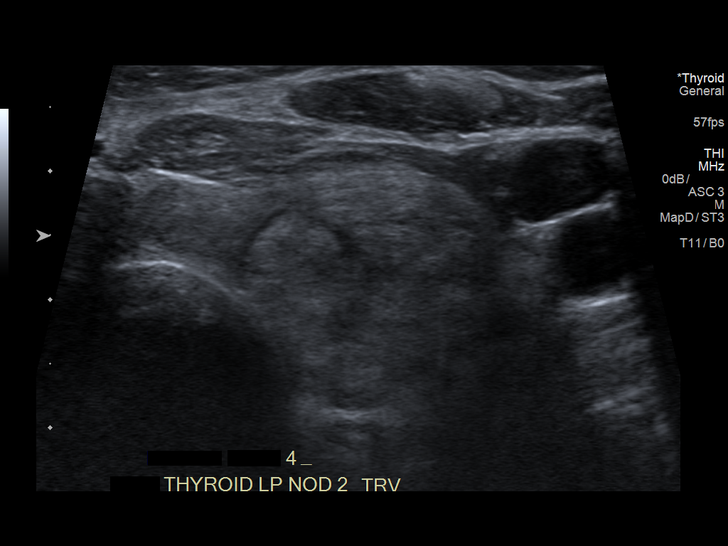
[im 17/21]
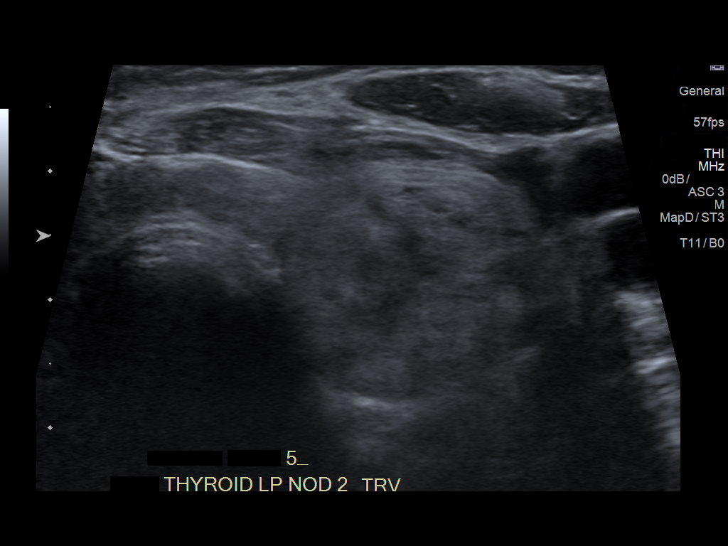
[im 19/21]
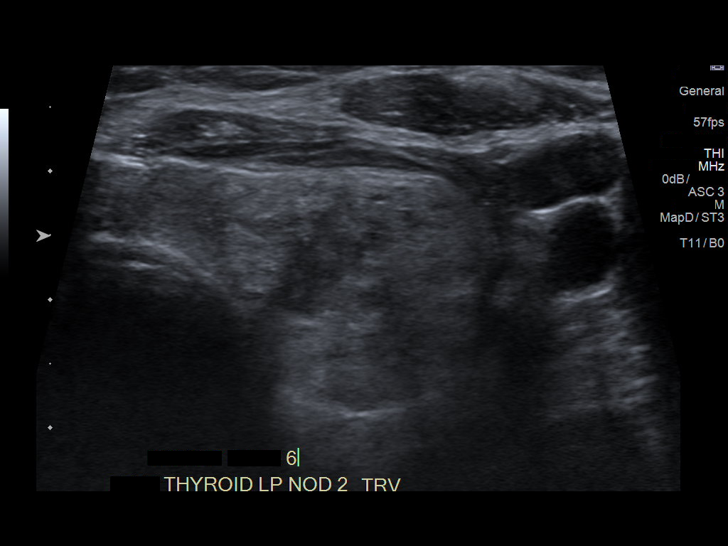
[im 21/21]
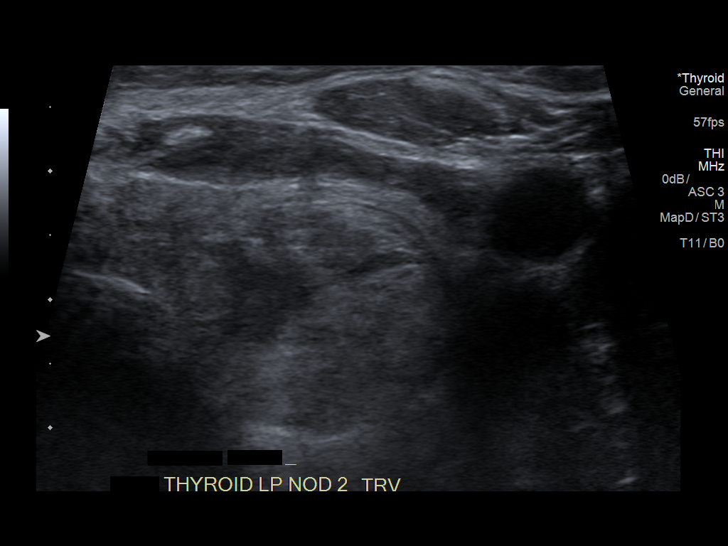

[13 of 21 positions shown; findings below may reference images not displayed]

Pre-procedural ultrasound scanning demonstrated unchanged size and
appearance of the indeterminate nodule within the inferior aspect
the left lobe of the thyroid measuring 2.7 cm in diameter.

The procedure was planned. The neck was prepped in the usual sterile
fashion, and a sterile drape was applied covering the operative
field. A timeout was performed prior to the initiation of the
procedure. Local anesthesia was provided with 1% lidocaine.

Under direct ultrasound guidance, 4 FNA biopsies were performed of
the dominant nodule within the inferior pole of the left lobe of the
thyroid with a 25 gauge needle. Multiple ultrasound images were
saved for procedural documentation purposes. The samples were
prepared and submitted to pathology.

Limited post procedural scanning was negative for hematoma or
additional complication. Dressings were placed. The patient
tolerated the above procedures procedure well without immediate
postprocedural complication.
FINDINGS: Nodule reference number based on prior diagnostic ultrasound: 2

Maximum size: 2.7 cm

Location: Left; Inferior

ACR TI-RADS risk category: TR3 (3 points)

Reason for biopsy: meets ACR TI-RADS criteria

Ultrasound imaging confirms appropriate placement of the needles
within the thyroid nodule.
IMPRESSION: Technically successful ultrasound guided fine needle aspiration of
nodule number #2 within the inferior pole of the left lobe of the
thyroid.

## 2018-12-30 ENCOUNTER — Other Ambulatory Visit: Payer: Self-pay | Admitting: Family Medicine

## 2018-12-30 DIAGNOSIS — F419 Anxiety disorder, unspecified: Secondary | ICD-10-CM

## 2018-12-31 NOTE — Telephone Encounter (Signed)
Please Review

## 2019-01-25 ENCOUNTER — Other Ambulatory Visit: Payer: Self-pay | Admitting: Family Medicine

## 2019-01-25 DIAGNOSIS — I10 Essential (primary) hypertension: Secondary | ICD-10-CM

## 2019-02-11 ENCOUNTER — Other Ambulatory Visit: Payer: Self-pay | Admitting: Family Medicine

## 2019-02-11 DIAGNOSIS — M2550 Pain in unspecified joint: Secondary | ICD-10-CM

## 2019-03-30 ENCOUNTER — Other Ambulatory Visit: Payer: Self-pay

## 2019-03-30 ENCOUNTER — Ambulatory Visit (INDEPENDENT_AMBULATORY_CARE_PROVIDER_SITE_OTHER): Payer: PPO | Admitting: Family Medicine

## 2019-03-30 ENCOUNTER — Encounter: Payer: Self-pay | Admitting: Family Medicine

## 2019-03-30 VITALS — BP 147/79 | HR 70 | Temp 98.0°F | Wt 203.0 lb

## 2019-03-30 DIAGNOSIS — L02521 Furuncle right hand: Secondary | ICD-10-CM | POA: Diagnosis not present

## 2019-03-30 MED ORDER — CEPHALEXIN 500 MG PO CAPS: 500.0000 mg | ORAL_CAPSULE | Freq: Two times a day (BID) | ORAL | 0 refills | Status: DC

## 2019-03-30 NOTE — Progress Notes (Signed)
Patient: Christy Mills Female    DOB: September 14, 1934   85 y.o.   MRN: [id number] Visit Date: 03/30/2019  Today's Provider: Vernie Murders, PA   Chief Complaint  Patient presents with  . Skin Problem   Subjective:    HPI  Skin Problem Patient presents today for possible infected right middle finger. Patient stated she noticed a bump on her finger Friday, 03/27/2019 but it has gotten worse since. Patient states that it is red/white, painful and not warm to touch. Patient states she put some antibiotic cream to the area and it did not help.  Past Medical History:  Diagnosis Date  . Anxiety 1980   difficulty sleeping after mother died.  . Arthritis   . Basal cell carcinoma (BCC) of skin of right upper lip    S/P MOHS Dr. Ishmael Holter [facility] 10/24/2018  . Cancer ([place])    skin resected from nose in 2014.  Marland Kitchen History of trigger finger   . Hyperlipidemia   . Hypertension   . Osteoporosis    Past Surgical History:  Procedure Laterality Date  . APPENDECTOMY  1978  . CATARACT EXTRACTION W/ INTRAOCULAR LENS IMPLANT Bilateral 2004   one eye than the other a month laster  . HYSTEROSCOPY W/D&C N/A 08/10/2016   Procedure: DILATATION AND CURETTAGE /HYSTEROSCOPY;  Surgeon: Honor Loh Ward, MD;  Location: [facility] ORS;  Service: Gynecology;  Laterality: N/A;  . JOINT REPLACEMENT Right 2015  . MOHS SURGERY  2014   nose  . MOHS SURGERY Right 10/24/2018   upper lip Ishmael Holter [facility] 10/24/2018  . OOPHORECTOMY Right   . 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 TOTAL KNEE ARTHROPLASTY Right 10/2013   Family History  Problem Relation Age of Onset  . Breast cancer Mother 72  . Hypertension Mother   . Diabetes Father        type 2  . Hypertension Father   . Cancer Father        lung  . Colon polyps Father   . Cancer Brother        brain  . Diabetes Brother        type 2   Allergies  Allergen Reactions  . Ciprofloxacin Hcl Other (See Comments)   tendinopathy    Current Outpatient Medications:  .  acetaminophen (TYLENOL) 500 MG tablet, Take 500 mg by mouth. , Disp: , Rfl:  .  amLODipine (NORVASC) 5 MG tablet, TAKE 1 TABLET BY MOUTH EVERY DAY, Disp: 90 tablet, Rfl: 3 .  aspirin EC 81 MG tablet, Take 162 mg by mouth. , Disp: , Rfl:  .  calcium carbonate (OSCAL) 1500 (600 Ca) MG TABS tablet, Take 600 mg of elemental calcium by mouth 2 (two) times daily with a meal. , Disp: , Rfl:  .  furosemide (LASIX) 20 MG tablet, Take 1 tablet (20 mg total) by mouth daily as needed., Disp: 90 tablet, Rfl: 1 .  lisinopril (PRINIVIL,ZESTRIL) 40 MG tablet, TAKE 1 TABLET (40 MG TOTAL) BY MOUTH DAILY., Disp: 90 tablet, Rfl: 4 .  LORazepam (ATIVAN) 1 MG tablet, Take 1 tablet (1 mg total) by mouth daily., Disp: 90 tablet, Rfl: 1 .  meloxicam (MOBIC) 7.5 MG tablet, TAKE 1 TABLET (7.5 MG TOTAL) BY MOUTH DAILY AS NEEDED., Disp: 90 tablet, Rfl: 3 .  Misc Natural Products (OSTEO BI-FLEX ADV DOUBLE ST PO), Take 1 tablet by mouth daily with lunch., Disp: , Rfl:  .  Multiple Vitamins-Minerals (CENTRUM SILVER 50+WOMEN) TABS, Take 1 tablet by mouth daily., Disp: , Rfl:  .  omeprazole (PRILOSEC) 20 MG capsule, TAKE 1 CAPSULE BY MOUTH EVERY DAY, Disp: 90 capsule, Rfl: 2 .  potassium chloride (K-DUR) 10 MEQ tablet, Take 1 tablet (10 mEq total) by mouth daily. On days she takes furosemide, Disp: 90 tablet, Rfl: 1 .  simvastatin (ZOCOR) 20 MG tablet, TAKE 1 TABLET BY MOUTH EVERY DAY, Disp: 90 tablet, Rfl: 3  Review of Systems  Constitutional: Negative.   Respiratory: Negative.   Genitourinary: Negative.   Neurological: Negative.    Social History   Tobacco Use  . Smoking status: Former Smoker    Types: Cigarettes    Quit date: 08/01/1991    Years since quitting: 27.6  . Smokeless tobacco: Never Used  . Tobacco comment: quit in 1992  Substance Use Topics  . Alcohol use: No    Alcohol/week: 0.0 standard drinks     Objective:   BP (!) 147/79 (BP Location: Left  Arm, Patient Position: Sitting, Cuff Size: Normal)   Pulse 70   Temp 98 F (36.7 C) (Oral)   Wt 203 lb (92.1 kg)   SpO2 97%   BMI 34.84 kg/m  Vitals:   03/30/19 1600  BP: (!) 147/79  Pulse: 70  Temp: 98 F (36.7 C)  TempSrc: Oral  SpO2: 97%  Weight: 203 lb (92.1 kg)   Physical Exam Constitutional:      General: She is not in acute distress.    Appearance: She is well-developed.  HENT:     Head: Normocephalic and atraumatic.     Right Ear: Hearing normal.     Left Ear: Hearing normal.     Nose: Nose normal.  Eyes:     General: Lids are normal. No scleral icterus.       Right eye: No discharge.        Left eye: No discharge.     Conjunctiva/sclera: Conjunctivae normal.  Pulmonary:     Effort: Pulmonary effort is normal. No respiratory distress.  Musculoskeletal:        General: Tenderness present.     Comments: Tender large pustule on the right middle finger dorsum of the middle phalange with surrounding erythema.  Skin:    Findings: No lesion or rash.  Neurological:     Mental Status: She is alert and oriented to person, place, and time.  Psychiatric:        Speech: Speech normal.        Behavior: Behavior normal.        Thought Content: Thought content normal.      Assessment & Plan    1. Furuncle of finger, right Onset of a large pustule on the dorsum of the right middle finger that started 03-27-19 after working in her garden with gloves on her hands. Was working with mulch, but, cannot remember any injuries. No fever but finger red and very tender. Drained pustule to get culture and treat with Keflex. Recommend hot Epsom Saltwater soaks daily for 15 minutes, then dress with Neosporin and Band-Aid after it is completely dry. Recheck pending culture report. - cephALEXin (KEFLEX) 500 MG capsule; Take 1 capsule (500 mg total) by mouth 2 (two) times daily.  Dispense: 14 capsule; Refill: 0 - Wound culture     Vernie Murders, PA  [facility]

## 2019-04-02 ENCOUNTER — Telehealth: Payer: Self-pay

## 2019-04-02 LAB — WOUND CULTURE

## 2019-04-02 LAB — SPECIMEN STATUS REPORT

## 2019-04-02 NOTE — Telephone Encounter (Signed)
advised

## 2019-04-02 NOTE — Telephone Encounter (Signed)
LMTCB ED 

## 2019-04-02 NOTE — Telephone Encounter (Signed)
-----   Message from [place], [place] sent at 04/02/2019 12:09 PM EDT ----- A few gram positive cocci with moderate routine flora on culture. Antibiotic given should help clear this up. Continue present treatment regimen and recheck in a week if any persistent problems with the finger infection.

## 2019-04-24 ENCOUNTER — Other Ambulatory Visit: Payer: Self-pay | Admitting: Family Medicine

## 2019-04-24 DIAGNOSIS — E785 Hyperlipidemia, unspecified: Secondary | ICD-10-CM

## 2019-04-29 ENCOUNTER — Other Ambulatory Visit: Payer: Self-pay

## 2019-05-19 ENCOUNTER — Other Ambulatory Visit: Payer: Self-pay | Admitting: Family Medicine

## 2019-05-19 DIAGNOSIS — M2550 Pain in unspecified joint: Secondary | ICD-10-CM

## 2019-06-03 DIAGNOSIS — B351 Tinea unguium: Secondary | ICD-10-CM | POA: Diagnosis not present

## 2019-06-03 DIAGNOSIS — M79674 Pain in right toe(s): Secondary | ICD-10-CM | POA: Diagnosis not present

## 2019-06-03 DIAGNOSIS — M2012 Hallux valgus (acquired), left foot: Secondary | ICD-10-CM | POA: Diagnosis not present

## 2019-06-03 DIAGNOSIS — M79675 Pain in left toe(s): Secondary | ICD-10-CM | POA: Diagnosis not present

## 2019-06-03 DIAGNOSIS — M2011 Hallux valgus (acquired), right foot: Secondary | ICD-10-CM | POA: Diagnosis not present

## 2019-06-30 ENCOUNTER — Other Ambulatory Visit: Payer: Self-pay | Admitting: Family Medicine

## 2019-06-30 DIAGNOSIS — F419 Anxiety disorder, unspecified: Secondary | ICD-10-CM

## 2019-07-07 ENCOUNTER — Ambulatory Visit (INDEPENDENT_AMBULATORY_CARE_PROVIDER_SITE_OTHER): Payer: PPO | Admitting: Family Medicine

## 2019-07-07 ENCOUNTER — Other Ambulatory Visit: Payer: Self-pay

## 2019-07-07 ENCOUNTER — Encounter: Payer: Self-pay | Admitting: Family Medicine

## 2019-07-07 VITALS — BP 130/62 | HR 67 | Temp 96.2°F | Resp 16 | Ht 64.0 in | Wt 198.0 lb

## 2019-07-07 DIAGNOSIS — Z23 Encounter for immunization: Secondary | ICD-10-CM | POA: Diagnosis not present

## 2019-07-07 DIAGNOSIS — F419 Anxiety disorder, unspecified: Secondary | ICD-10-CM | POA: Diagnosis not present

## 2019-07-07 DIAGNOSIS — E7800 Pure hypercholesterolemia, unspecified: Secondary | ICD-10-CM | POA: Diagnosis not present

## 2019-07-07 DIAGNOSIS — H9113 Presbycusis, bilateral: Secondary | ICD-10-CM

## 2019-07-07 DIAGNOSIS — I10 Essential (primary) hypertension: Secondary | ICD-10-CM | POA: Diagnosis not present

## 2019-07-07 DIAGNOSIS — Z0000 Encounter for general adult medical examination without abnormal findings: Secondary | ICD-10-CM

## 2019-07-07 DIAGNOSIS — F5101 Primary insomnia: Secondary | ICD-10-CM | POA: Insufficient documentation

## 2019-07-07 DIAGNOSIS — E2839 Other primary ovarian failure: Secondary | ICD-10-CM

## 2019-07-07 DIAGNOSIS — E041 Nontoxic single thyroid nodule: Secondary | ICD-10-CM | POA: Diagnosis not present

## 2019-07-07 DIAGNOSIS — H9190 Unspecified hearing loss, unspecified ear: Secondary | ICD-10-CM | POA: Insufficient documentation

## 2019-07-07 NOTE — Patient Instructions (Addendum)
.   Please review the attached list of medications and notify my office if there are any errors.   . Please bring all of your medications to every appointment so we can make sure that our medication list is the same as yours.   Please go to the lab draw station in [address] on the second floor of [facility] . Normal hours are 8:00am to 12:30pm and 1:30pm to 4:00pm Monday through Friday

## 2019-07-07 NOTE — Progress Notes (Signed)
Patient: Christy Mills, Female    DOB: 05-22-1934, 85 y.o.   MRN: [id number] Visit Date: 07/07/2019  Today's Provider: Lelon Huh, MD   Chief Complaint  Patient presents with  . Medicare Wellness   Subjective:     Annual wellness visit Christy Mills is a 85 y.o. female. She feels fairly well. She reports no regular exercising. She reports she is sleeping poorly.  -----------------------------------------------------------  Hypertension, follow-up:  BP Readings from Last 3 Encounters:  07/07/19 130/62  03/30/19 (!) 147/79  09/03/18 134/68    She was last seen for hypertension 10 months ago.  BP at that visit was 134/68. Management since that visit includes no changes. She reports good compliance with treatment. She is not having side effects.  She is not exercising. She is adherent to low salt diet.   Outside blood pressures are not being checked. She is experiencing lower extremity edema.  Patient denies chest pain, chest pressure/discomfort, claudication, dyspnea, exertional chest pressure/discomfort, fatigue, irregular heart beat, near-syncope, orthopnea, palpitations, paroxysmal nocturnal dyspnea, syncope and tachypnea.   Cardiovascular risk factors include advanced age (older than 45 for men, 60 for women), dyslipidemia and hypertension.  Use of agents associated with hypertension: NSAIDS.     Weight trend: fluctuating a bit Wt Readings from Last 3 Encounters:  07/07/19 198 lb (89.8 kg)  03/30/19 203 lb (92.1 kg)  09/03/18 205 lb (93 kg)    Current diet: well balanced  ------------------------------------------------------------------------  Lipid/Cholesterol, Follow-up:   Last seen for this10 months ago.  Management changes since that visit include none. . Last Lipid Panel:    Component Value Date/Time   CHOL 161 09/03/2018 1212   TRIG 132 09/03/2018 1212   HDL 46 09/03/2018 1212   CHOLHDL 3.5 09/03/2018 1212   LDLCALC 89 09/03/2018 1212     Risk factors for vascular disease include hypercholesterolemia and hypertension  She reports good compliance with treatment. She is not having side effects.  Current symptoms include none and have been stable. Weight trend: fluctuating a bit Prior visit with dietician: no Current diet: well balanced Current exercise: none  Wt Readings from Last 3 Encounters:  07/07/19 198 lb (89.8 kg)  03/30/19 203 lb (92.1 kg)  09/03/18 205 lb (93 kg)    -------------------------------------------------------------------  Follow up for Osteoporosis:  The patient was last seen for this 10 months ago. Changes made at last visit include none; BMD was ordered but patient cancelled appointment.  She reports good compliance with treatment. She feels that condition is Unchanged. She is not having side effects.   ------------------------------------------------------------------------------------  Follow up for Thyroid Nodule:  The patient was last seen for this 10 months ago. Changes made at last visit include none. This problems is followed by Dr. Kathyrn Sheriff.  She reports good compliance with treatment. She feels that condition is Unchanged. She is not having side effects.   ------------------------------------------------------------------------------------   Review of Systems  Constitutional: Negative for chills, fatigue and fever.  HENT: Negative for congestion, ear pain, rhinorrhea, sneezing and sore throat.   Eyes: Negative.  Negative for pain and redness.  Respiratory: Negative for cough, shortness of breath and wheezing.   Cardiovascular: Positive for leg swelling. Negative for chest pain.  Gastrointestinal: Negative for abdominal pain, blood in stool, constipation, diarrhea and nausea.  Endocrine: Negative for polydipsia and polyphagia.  Genitourinary: Negative.  Negative for dysuria, flank pain, hematuria, pelvic pain, vaginal bleeding and vaginal discharge.  Musculoskeletal:  Positive for arthralgias  and back pain. Negative for gait problem and joint swelling.  Skin: Negative for rash.  Neurological: Negative.  Negative for dizziness, tremors, seizures, weakness, light-headedness, numbness and headaches.  Hematological: Negative for adenopathy.  Psychiatric/Behavioral: Negative.  Negative for behavioral problems, confusion and dysphoric mood. The patient is not nervous/anxious and is not hyperactive.     Social History   Socioeconomic History  . Marital status: Married    Spouse name: Not on file  . Number of children: Not on file  . Years of education: Not on file  . Highest education level: Not on file  Occupational History  . Not on file  Social Needs  . Financial resource strain: Not on file  . Food insecurity    Worry: Not on file    Inability: Not on file  . Transportation needs    Medical: Not on file    Non-medical: Not on file  Tobacco Use  . Smoking status: Former Smoker    Types: Cigarettes    Quit date: 08/01/1991    Years since quitting: 27.9  . Smokeless tobacco: Never Used  . Tobacco comment: quit in 1992  Substance and Sexual Activity  . Alcohol use: No    Alcohol/week: 0.0 standard drinks  . Drug use: No  . Sexual activity: Not on file  Lifestyle  . Physical activity    Days per week: Not on file    Minutes per session: Not on file  . Stress: Not on file  Relationships  . Social Herbalist on phone: Not on file    Gets together: Not on file    Attends religious service: Not on file    Active member of club or organization: Not on file    Attends meetings of clubs or organizations: Not on file    Relationship status: Not on file  . Intimate partner violence    Fear of current or ex partner: Not on file    Emotionally abused: Not on file    Physically abused: Not on file    Forced sexual activity: Not on file  Other Topics Concern  . Not on file  Social History Narrative  . Not on file    Past Medical  History:  Diagnosis Date  . Anxiety 1980   difficulty sleeping after mother died.  . Arthritis   . Basal cell carcinoma (BCC) of skin of right upper lip    S/P MOHS Dr. Ishmael Holter [facility] 10/24/2018  . Cancer ([place])    skin resected from nose in 2014.  Marland Kitchen History of trigger finger   . Hyperlipidemia   . Hypertension   . Osteoporosis      Patient Active Problem List   Diagnosis Date Noted  . Primary osteoarthritis of left knee 07/24/2017  . Thyroid nodule 02/23/2017  . Acid reflux 12/19/2015  . Urinary frequency 12/19/2015  . Arthritis 09/12/2015  . Hypercholesterolemia 09/12/2015  . BP (high blood pressure) 09/12/2015  . Anxiety 05/26/2015  . Hyperlipemia 03/24/2015  . Osteoporosis 03/24/2015    Past Surgical History:  Procedure Laterality Date  . APPENDECTOMY  1978  . CATARACT EXTRACTION W/ INTRAOCULAR LENS IMPLANT Bilateral 2004   one eye than the other a month laster  . HYSTEROSCOPY W/D&C N/A 08/10/2016   Procedure: DILATATION AND CURETTAGE /HYSTEROSCOPY;  Surgeon: Honor Loh Ward, MD;  Location: [facility] ORS;  Service: Gynecology;  Laterality: N/A;  . JOINT REPLACEMENT Right 2015  . MOHS SURGERY  2014  nose  . MOHS SURGERY Right 10/24/2018   upper lip Ishmael Holter [facility] 10/24/2018  . OOPHORECTOMY Right   . 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 TOTAL KNEE ARTHROPLASTY Right 10/2013    Her family history includes Breast cancer (age of onset: 66) in her mother; Cancer in her brother and father; Colon polyps in her father; Diabetes in her brother and father; Hypertension in her father and mother.   Current Outpatient Medications:  .  acetaminophen (TYLENOL) 500 MG tablet, Take 500 mg by mouth. , Disp: , Rfl:  .  amLODipine (NORVASC) 5 MG tablet, TAKE 1 TABLET BY MOUTH EVERY DAY, Disp: 90 tablet, Rfl: 3 .  aspirin EC 81 MG tablet, Take 162 mg by mouth. , Disp: , Rfl:  .  calcium carbonate (OSCAL) 1500 (600 Ca) MG TABS tablet, Take 600 mg of  elemental calcium by mouth 2 (two) times daily with a meal. , Disp: , Rfl:  .  lisinopril (PRINIVIL,ZESTRIL) 40 MG tablet, TAKE 1 TABLET (40 MG TOTAL) BY MOUTH DAILY., Disp: 90 tablet, Rfl: 4 .  LORazepam (ATIVAN) 1 MG tablet, TAKE 1 TABLET BY MOUTH EVERY DAY (Patient taking differently: Take 1 mg by mouth at bedtime. ), Disp: 90 tablet, Rfl: 1 .  meloxicam (MOBIC) 7.5 MG tablet, TAKE 1 TABLET (7.5 MG TOTAL) BY MOUTH DAILY AS NEEDED., Disp: 90 tablet, Rfl: 4 .  Misc Natural Products (OSTEO BI-FLEX ADV DOUBLE ST PO), Take 1 tablet by mouth daily with lunch., Disp: , Rfl:  .  Multiple Vitamins-Minerals (CENTRUM SILVER 50+WOMEN) TABS, Take 1 tablet by mouth daily., Disp: , Rfl:  .  omeprazole (PRILOSEC) 20 MG capsule, TAKE 1 CAPSULE BY MOUTH EVERY DAY, Disp: 90 capsule, Rfl: 2 .  simvastatin (ZOCOR) 20 MG tablet, TAKE 1 TABLET BY MOUTH EVERY DAY, Disp: 90 tablet, Rfl: 1  Patient Care Team: Birdie Sons, MD as PCP - General (Family Medicine) Thelma Comp, [place] as Consulting Physician (Optometry) Hooten, Laurice Record, MD as Consulting Physician (Orthopedic Surgery) Ralene Bathe, MD (Dermatology) Merril Abbe, MD as Referring Physician (Dermatology)    Objective:    Vitals: BP 130/62 (BP Location: Left Arm, Patient Position: Sitting, Cuff Size: Large)   Pulse 67   Temp (!) 96.2 F (35.7 C) (Temporal)   Resp 16   Ht 5\' 4"  (1.626 m)   Wt 198 lb (89.8 kg)   SpO2 99% Comment: room air  BMI 33.99 kg/m   Physical Exam   General Appearance:    Obese female in no acute distress  Eyes:    PERRL, conjunctiva/corneas clear, EOM's intact   Hard of hearing  Lungs:     Clear to auscultation bilaterally, respirations unlabored  Heart:    Normal heart rate. Normal rhythm. No murmurs, rubs, or gallops.   MS:   All extremities are intact. 2+ bipedal edema  Neurologic:   Awake, alert, oriented x 3. No apparent focal neurological           defect.        Activities of Daily Living In  your present state of health, do you have any difficulty performing the following activities: 07/07/2019 09/03/2018  Hearing? Tempie Donning  Vision? N N  Difficulty concentrating or making decisions? N N  Walking or climbing stairs? N N  Dressing or bathing? N N  Doing errands, shopping? N N  Some recent data might be hidden    Fall Risk  Assessment Fall Risk  07/07/2019 05/31/2017 12/19/2015  Falls in the past year? 0 Yes No  Number falls in past yr: 0 1 -  Injury with Fall? 0 No -  Follow up Falls evaluation completed Falls prevention discussed -     Depression Screen PHQ 2/9 Scores 07/07/2019 09/03/2018 05/31/2017 12/19/2015  PHQ - 2 Score 1 0 0 0  PHQ- 9 Score 3 0 - -    No flowsheet data found.    Assessment & Plan:     Annual Wellness Visit  Reviewed patient's Family Medical History Reviewed and updated list of patient's medical providers Assessment of cognitive impairment was done Assessed patient's functional ability Established a written schedule for health screening [place] Completed and Reviewed  Exercise Activities and Dietary recommendations Goals    . Reduce sugar intake      Recommend decreasing sugar in daily diet. Pt cut out eating desserts.        Immunization History  Administered Date(s) Administered  . Fluad Quad(high Dose 65+) 07/07/2019  . Influenza, High Dose Seasonal PF 09/02/2018  . Influenza-Unspecified 08/27/2015  . Pneumococcal Conjugate-13 05/13/2014  . Pneumococcal Polysaccharide-23 08/17/2004  . Pneumococcal-Unspecified 08/17/2004  . Td 07/27/2005  . Zoster 07/04/2012    Health Maintenance  Topic Date Due  . TETANUS/TDAP  07/28/2015  . INFLUENZA VACCINE  05/02/2019  . DEXA SCAN  05/25/2019  . PNA vac Low Risk Adult  Completed     Discussed health benefits of physical activity, and encouraged her to engage in regular exercise appropriate for her age and condition.     ------------------------------------------------------------------------------------------------------------  1. Medicare annual wellness visit, subsequent   2. Estrogen deficiency  - DG Bone Density; Future  3. Essential hypertension Well controlled.  Continue current medications.    4. Pure hypercholesterolemia She is tolerating simvastatin well with no adverse effects.   - Comprehensive metabolic panel - Lipid panel  5. Thyroid nodule  - T4, free - TSH  6. Need for influenza vaccination  - Flu Vaccine QUAD High Dose(Fluad)  7. Anxiety   8. Primary insomnia Doing fairly well taking lorazepam at bedtime only.   9. Presbycusis of both ears    Lelon Huh, MD  [facility]

## 2019-07-10 DIAGNOSIS — E041 Nontoxic single thyroid nodule: Secondary | ICD-10-CM | POA: Diagnosis not present

## 2019-07-10 DIAGNOSIS — E7800 Pure hypercholesterolemia, unspecified: Secondary | ICD-10-CM | POA: Diagnosis not present

## 2019-07-11 LAB — COMPREHENSIVE METABOLIC PANEL
ALT: 18 IU/L (ref 0–32)
AST: 18 IU/L (ref 0–40)
Albumin/Globulin Ratio: 1.5 (ref 1.2–2.2)
Albumin: 4.1 g/dL (ref 3.6–4.6)
Alkaline Phosphatase: 86 IU/L (ref 39–117)
BUN/Creatinine Ratio: 22 (ref 12–28)
BUN: 15 mg/dL (ref 8–27)
Bilirubin Total: 0.4 mg/dL (ref 0.0–1.2)
CO2: 24 mmol/L (ref 20–29)
Calcium: 9.6 mg/dL (ref 8.7–10.3)
Chloride: 105 mmol/L (ref 96–106)
Creatinine, Ser: 0.69 mg/dL (ref 0.57–1.00)
GFR calc Af Amer: 93 mL/min/{1.73_m2} (ref >59)
GFR calc non Af Amer: 81 mL/min/{1.73_m2} (ref >59)
Globulin, Total: 2.7 g/dL (ref 1.5–4.5)
Glucose: 95 mg/dL (ref 65–99)
Potassium: 4.2 mmol/L (ref 3.5–5.2)
Sodium: 143 mmol/L (ref 134–144)
Total Protein: 6.8 g/dL (ref 6.0–8.5)

## 2019-07-11 LAB — TSH: TSH: 1.29 u[IU]/mL (ref 0.450–4.500)

## 2019-07-11 LAB — LIPID PANEL
Chol/HDL Ratio: 4 ratio (ref 0.0–4.4)
Cholesterol, Total: 171 mg/dL (ref 100–199)
HDL: 43 mg/dL (ref >39)
LDL Chol Calc (NIH): 101 mg/dL — ABNORMAL HIGH (ref 0–99)
Triglycerides: 153 mg/dL — ABNORMAL HIGH (ref 0–149)
VLDL Cholesterol Cal: 27 mg/dL (ref 5–40)

## 2019-07-11 LAB — T4, FREE: Free T4: 1.18 ng/dL (ref 0.82–1.77)

## 2019-07-27 ENCOUNTER — Other Ambulatory Visit: Payer: PPO

## 2019-07-30 ENCOUNTER — Ambulatory Visit
Admission: RE | Admit: 2019-07-30 | Discharge: 2019-07-30 | Disposition: A | Discharge status: Home / Self Care | Payer: PPO | Source: Ambulatory Visit | Attending: Family Medicine | Admitting: Family Medicine

## 2019-07-30 ENCOUNTER — Other Ambulatory Visit: Payer: Self-pay | Admitting: Family Medicine

## 2019-07-30 DIAGNOSIS — E2839 Other primary ovarian failure: Secondary | ICD-10-CM

## 2019-07-30 DIAGNOSIS — Z1231 Encounter for screening mammogram for malignant neoplasm of breast: Secondary | ICD-10-CM | POA: Diagnosis not present

## 2019-07-30 DIAGNOSIS — M8589 Other specified disorders of bone density and structure, multiple sites: Secondary | ICD-10-CM | POA: Insufficient documentation

## 2019-07-30 DIAGNOSIS — M85852 Other specified disorders of bone density and structure, left thigh: Secondary | ICD-10-CM | POA: Diagnosis not present

## 2019-07-30 DIAGNOSIS — Z780 Asymptomatic menopausal state: Secondary | ICD-10-CM | POA: Diagnosis not present

## 2019-07-31 ENCOUNTER — Telehealth: Payer: Self-pay

## 2019-07-31 NOTE — Telephone Encounter (Signed)
-----   Message from Birdie Sons, MD sent at 07/30/2019  4:17 PM EDT ----- Bone density shows thinning of bones since 2015, not quite enough to require prescription medications, but do recommend OTC vitamin D3 2000 iu daily. Recheck BMD in 2 years.

## 2019-07-31 NOTE — Telephone Encounter (Signed)
Attempted to contact patient, no answer or voicemail. 

## 2019-10-08 DIAGNOSIS — H26493 Other secondary cataract, bilateral: Secondary | ICD-10-CM | POA: Diagnosis not present

## 2019-10-17 ENCOUNTER — Other Ambulatory Visit: Payer: Self-pay

## 2019-10-17 ENCOUNTER — Encounter: Payer: Self-pay | Admitting: Emergency Medicine

## 2019-10-17 ENCOUNTER — Emergency Department
Admission: EM | Admit: 2019-10-17 | Discharge: 2019-10-17 | Disposition: A | Discharge status: Home / Self Care | Payer: PPO | Attending: Emergency Medicine | Admitting: Emergency Medicine

## 2019-10-17 ENCOUNTER — Emergency Department: Payer: PPO

## 2019-10-17 DIAGNOSIS — Z79899 Other long term (current) drug therapy: Secondary | ICD-10-CM | POA: Diagnosis not present

## 2019-10-17 DIAGNOSIS — S6992XA Unspecified injury of left wrist, hand and finger(s), initial encounter: Secondary | ICD-10-CM | POA: Diagnosis not present

## 2019-10-17 DIAGNOSIS — Y929 Unspecified place or not applicable: Secondary | ICD-10-CM | POA: Insufficient documentation

## 2019-10-17 DIAGNOSIS — Y9389 Activity, other specified: Secondary | ICD-10-CM | POA: Insufficient documentation

## 2019-10-17 DIAGNOSIS — Z7982 Long term (current) use of aspirin: Secondary | ICD-10-CM | POA: Diagnosis not present

## 2019-10-17 DIAGNOSIS — Z85828 Personal history of other malignant neoplasm of skin: Secondary | ICD-10-CM | POA: Insufficient documentation

## 2019-10-17 DIAGNOSIS — Y998 Other external cause status: Secondary | ICD-10-CM | POA: Insufficient documentation

## 2019-10-17 DIAGNOSIS — S63502A Unspecified sprain of left wrist, initial encounter: Secondary | ICD-10-CM

## 2019-10-17 DIAGNOSIS — Z96651 Presence of right artificial knee joint: Secondary | ICD-10-CM | POA: Insufficient documentation

## 2019-10-17 DIAGNOSIS — W010XXA Fall on same level from slipping, tripping and stumbling without subsequent striking against object, initial encounter: Secondary | ICD-10-CM | POA: Insufficient documentation

## 2019-10-17 DIAGNOSIS — M25532 Pain in left wrist: Secondary | ICD-10-CM | POA: Diagnosis not present

## 2019-10-17 DIAGNOSIS — Z87891 Personal history of nicotine dependence: Secondary | ICD-10-CM | POA: Insufficient documentation

## 2019-10-17 DIAGNOSIS — I10 Essential (primary) hypertension: Secondary | ICD-10-CM | POA: Insufficient documentation

## 2019-10-17 DIAGNOSIS — W19XXXA Unspecified fall, initial encounter: Secondary | ICD-10-CM

## 2019-10-17 MED ORDER — TRAMADOL HCL 50 MG PO TABS
50.0000 mg | ORAL_TABLET | Freq: Once | ORAL | Status: AC
  Administered 2019-10-17: 50 mg via ORAL
  Filled 2019-10-17: qty 1

## 2019-10-17 MED ORDER — TRAMADOL HCL 50 MG PO TABS: 50.0000 mg | ORAL_TABLET | Freq: Four times a day (QID) | ORAL | 0 refills | Status: DC | PRN

## 2019-10-17 NOTE — ED Provider Notes (Signed)
[facility] Emergency Department Provider Note  ____________________________________________  Time seen: Approximately 8:21 PM  I have reviewed the triage vital signs and the nursing notes.   HISTORY  Chief Complaint Wrist Injury    HPI Christy Mills is a 86 y.o. female who presents the emergency department for evaluation of a wrist injury after a mechanical fall.  Patient tripped, fell onto an outstretched hand.  She did not hit her head or lose consciousness.  She states that she was unable to "get out of the floor by myself."  EMS arrived to the patient's residence, recommended that patient seek medical care to ensure no fracture.  Patient denies any headache, visual changes, neck pain, chest pain, shortness of breath, domino pain, nausea vomiting.  No loss consciousness.  Only complaint at this time is left wrist pain.         Past Medical History:  Diagnosis Date  . Anxiety 1980   difficulty sleeping after mother died.  . Arthritis   . Basal cell carcinoma (BCC) of skin of right upper lip    S/P MOHS Dr. Ishmael Holter [facility] 10/24/2018  . Cancer ([place])    skin resected from nose in 2014.  Marland Kitchen History of trigger finger   . Hyperlipidemia   . Hypertension   . Osteoporosis     Patient Active Problem List   Diagnosis Date Noted  . HOH (hard of hearing) 07/07/2019  . Primary insomnia 07/07/2019  . Primary osteoarthritis of left knee 07/24/2017  . Thyroid nodule 02/23/2017  . Acid reflux 12/19/2015  . Urinary frequency 12/19/2015  . Arthritis 09/12/2015  . Hypercholesterolemia 09/12/2015  . BP (high blood pressure) 09/12/2015  . Anxiety 05/26/2015  . Hyperlipemia 03/24/2015  . Osteoporosis 03/24/2015    Past Surgical History:  Procedure Laterality Date  . APPENDECTOMY  1978  . CATARACT EXTRACTION W/ INTRAOCULAR LENS IMPLANT Bilateral 2004   one eye than the other a month laster  . HYSTEROSCOPY WITH D & C N/A 08/10/2016   Procedure:  DILATATION AND CURETTAGE /HYSTEROSCOPY;  Surgeon: Honor Loh Ward, MD;  Location: [facility] ORS;  Service: Gynecology;  Laterality: N/A;  . JOINT REPLACEMENT Right 2015  . MOHS SURGERY  2014   nose  . MOHS SURGERY Right 10/24/2018   upper lip Ishmael Holter [facility] 10/24/2018  . OOPHORECTOMY Right   . 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 TOTAL KNEE ARTHROPLASTY Right 10/2013    Prior to Admission medications   Medication Sig Start Date End Date Taking? Authorizing Provider  acetaminophen (TYLENOL) 500 MG tablet Take 500 mg by mouth.     [provider]  amLODipine (NORVASC) 5 MG tablet TAKE 1 TABLET BY MOUTH EVERY DAY 01/25/19   Birdie Sons, MD  aspirin EC 81 MG tablet Take 162 mg by mouth.     [provider]  calcium carbonate (OSCAL) 1500 (600 Ca) MG TABS tablet Take 600 mg of elemental calcium by mouth 2 (two) times daily with a meal.     [provider]  lisinopril (PRINIVIL,ZESTRIL) 40 MG tablet TAKE 1 TABLET (40 MG TOTAL) BY MOUTH DAILY. 12/10/18   Birdie Sons, MD  LORazepam (ATIVAN) 1 MG tablet TAKE 1 TABLET BY MOUTH EVERY DAY Patient taking differently: Take 1 mg by mouth at bedtime.  06/30/19   Birdie Sons, MD  meloxicam (MOBIC) 7.5 MG tablet TAKE 1 TABLET (7.5 MG TOTAL) BY MOUTH DAILY AS  NEEDED. 05/19/19   Birdie Sons, MD  Misc Natural Products (OSTEO BI-FLEX ADV DOUBLE ST PO) Take 1 tablet by mouth daily with lunch.    [provider]  Multiple Vitamins-Minerals (CENTRUM SILVER 50+WOMEN) TABS Take 1 tablet by mouth daily.    [provider]  omeprazole (PRILOSEC) 20 MG capsule TAKE 1 CAPSULE BY MOUTH EVERY DAY 07/25/18   Birdie Sons, MD  simvastatin (ZOCOR) 20 MG tablet TAKE 1 TABLET BY MOUTH EVERY DAY 04/24/19   Birdie Sons, MD    Allergies Ciprofloxacin hcl  Family History  Problem Relation Age of Onset  . Breast cancer Mother 41  . Hypertension Mother   . Diabetes Father         type 2  . Hypertension Father   . Cancer Father        lung  . Colon polyps Father   . Cancer Brother        brain  . Diabetes Brother        type 2    Social History Social History   Tobacco Use  . Smoking status: Former Smoker    Types: Cigarettes    Quit date: 08/01/1991    Years since quitting: 28.2  . Smokeless tobacco: Never Used  . Tobacco comment: quit in 1992  Substance Use Topics  . Alcohol use: No    Alcohol/week: 0.0 standard drinks  . Drug use: No     Review of Systems  Constitutional: No fever/chills Eyes: No visual changes. No discharge ENT: No upper respiratory complaints. Cardiovascular: no chest pain. Respiratory: no cough. No SOB. Gastrointestinal: No abdominal pain.  No nausea, no vomiting.  No diarrhea.  No constipation. Musculoskeletal: Left wrist pain after mechanical fall Skin: Negative for rash, abrasions, lacerations, ecchymosis. Neurological: Negative for headaches, focal weakness or numbness. 10-point ROS otherwise negative.  ____________________________________________   PHYSICAL EXAM:  VITAL SIGNS: ED Triage Vitals  Enc Vitals Group     BP 10/17/19 1829 (!) 181/76     Pulse Rate 10/17/19 1829 72     Resp 10/17/19 1829 18     Temp 10/17/19 1829 98.5 F (36.9 C)     Temp Source 10/17/19 1829 Oral     SpO2 10/17/19 1829 97 %     Weight 10/17/19 1838 200 lb (90.7 kg)     Height 10/17/19 1838 5\' 4"  (1.626 m)     Head Circumference --      Peak Flow --      Pain Score 10/17/19 1838 4     Pain Loc --      Pain Edu? --      Excl. in [place]? --      Constitutional: Alert and oriented. Well appearing and in no acute distress. Eyes: Conjunctivae are normal. PERRL. EOMI. Head: Atraumatic. ENT:      Ears:       Nose: No congestion/rhinnorhea.      Mouth/Throat: Mucous membranes are moist.  Neck: No stridor.    Cardiovascular: Normal rate, regular rhythm. Normal S1 and S2.  Good peripheral circulation. Respiratory: Normal  respiratory effort without tachypnea or retractions. Lungs CTAB. Good air entry to the bases with no decreased or absent breath sounds. Musculoskeletal: Full range of motion to all extremities. No gross deformities appreciated.  Visualization of the left upper extremity reveals good range of motion for the shoulder, elbow, wrist and all digits left hand.  Patient has minimal edema about the wrist when compared  with right side.  No open wounds.  Patient has tenderness to palpation over the distal radius.  No palpable abnormality or deficit.  No extension into the carpal bones.  No extension to the elbow joint itself.  Radial pulse intact.  Sensation intact all digits. Neurologic:  Normal speech and language. No gross focal neurologic deficits are appreciated.  Skin:  Skin is warm, dry and intact. No rash noted. Psychiatric: Mood and affect are normal. Speech and behavior are normal. Patient exhibits appropriate insight and judgement.   ____________________________________________   LABS (all labs ordered are listed, but only abnormal results are displayed)  Labs Reviewed - No data to display ____________________________________________  EKG   ____________________________________________  RADIOLOGY I personally viewed and evaluated these images as part of my medical decision making, as well as reviewing the written report by the radiologist.  DG Wrist Complete Left  Result Date: 10/17/2019 CLINICAL DATA:  86 year old female with fall and left wrist pain. EXAM: LEFT WRIST - COMPLETE 3+ VIEW COMPARISON:  None. FINDINGS: There is no acute fracture or dislocation. The bones are osteopenic. There is severe osteoarthritic changes of the base of the thumb. There is chondrocalcinosis of the TFCC. The soft tissues are unremarkable. IMPRESSION: 1. No acute fracture or dislocation. 2. Osteoarthritic changes of the base of the thumb Electronically Signed   By: Anner Crete M.D.   On: 10/17/2019 19:06     ____________________________________________    PROCEDURES  Procedure(s) performed:    .Splint Application  Date/Time: 10/17/2019 8:33 PM Performed by: Darletta Moll, PA-C Authorized by: Darletta Moll, PA-C   Consent:    Consent obtained:  Verbal   Consent given by:  Patient   Risks discussed:  Pain and swelling Pre-procedure details:    Sensation:  Normal Procedure details:    Laterality:  Left   Location:  Wrist   Wrist:  L wrist   Splint type:  Wrist   Supplies:  Prefabricated splint Post-procedure details:    Pain:  Improved   Sensation:  Normal   Patient tolerance of procedure:  Tolerated well, no immediate complications      Medications  traMADol (ULTRAM) tablet 50 mg (has no administration in time range)     ____________________________________________   INITIAL IMPRESSION / ASSESSMENT AND PLAN / ED COURSE  Pertinent labs & imaging results that were available during my care of the patient were reviewed by me and considered in my medical decision making (see chart for details).  Review of the [place] CSRS was performed in accordance of the [place] prior to dispensing any controlled drugs.           Patient's diagnosis is consistent with left wrist sprain.  Patient presented to emergency department for evaluation of left wrist pain after mechanical fall.  Patient had good range of motion.  Differential included fracture, dislocation, ligament injury, contusion, sprain.  Imaging is reassuring.  This time patient will be provided a Velcro wrist splint for symptom improvement.  Ultram for pain relief.  While discussing patient's recovery, patient mentioned that she had a trigger finger to the left hand.  She has had multiple injections into the area but it is not improving.  She is requesting information for hand surgery so "I can have it fixed."  Patient will be provided information for hand surgery..  Follow-up with hand surgery or primary care  as needed.  Patient is given ED precautions to return to the ED for any worsening or new symptoms.  ____________________________________________  FINAL CLINICAL IMPRESSION(S) / ED DIAGNOSES  Final diagnoses:  Fall, initial encounter  Sprain of left wrist, initial encounter      NEW MEDICATIONS STARTED DURING THIS VISIT:  ED Discharge Orders    None          This chart was dictated using voice recognition software/Dragon. Despite best efforts to proofread, errors can occur which can change the meaning. Any change was purely unintentional.    Darletta Moll, PA-C 10/17/19 2037    Duffy Bruce, MD 10/19/19 1225

## 2019-10-17 NOTE — ED Triage Notes (Signed)
Fell today, L wrist pain.

## 2019-10-22 ENCOUNTER — Other Ambulatory Visit: Payer: Self-pay | Admitting: Family Medicine

## 2019-10-22 DIAGNOSIS — E785 Hyperlipidemia, unspecified: Secondary | ICD-10-CM

## 2019-11-16 ENCOUNTER — Other Ambulatory Visit: Payer: Self-pay | Admitting: Family Medicine

## 2019-11-16 DIAGNOSIS — K219 Gastro-esophageal reflux disease without esophagitis: Secondary | ICD-10-CM

## 2019-11-16 NOTE — Telephone Encounter (Signed)
Requested medications are due for refill today?  Past due.  Medication prescription expired on 07/25/2019  Requested medications are on active medication list?  Yes  Last Refill:  07/25/2018 # 90 with 2 refills  Future visit scheduled?  No  Notes to Clinic:

## 2019-11-17 DIAGNOSIS — M79674 Pain in right toe(s): Secondary | ICD-10-CM | POA: Diagnosis not present

## 2019-11-17 DIAGNOSIS — M79675 Pain in left toe(s): Secondary | ICD-10-CM | POA: Diagnosis not present

## 2019-11-17 DIAGNOSIS — B351 Tinea unguium: Secondary | ICD-10-CM | POA: Diagnosis not present

## 2019-12-04 DIAGNOSIS — M65341 Trigger finger, right ring finger: Secondary | ICD-10-CM | POA: Diagnosis not present

## 2019-12-04 DIAGNOSIS — M65342 Trigger finger, left ring finger: Secondary | ICD-10-CM | POA: Diagnosis not present

## 2019-12-04 DIAGNOSIS — M19032 Primary osteoarthritis, left wrist: Secondary | ICD-10-CM | POA: Diagnosis not present

## 2019-12-04 DIAGNOSIS — W010XXA Fall on same level from slipping, tripping and stumbling without subsequent striking against object, initial encounter: Secondary | ICD-10-CM | POA: Diagnosis not present

## 2019-12-04 DIAGNOSIS — M25532 Pain in left wrist: Secondary | ICD-10-CM | POA: Diagnosis not present

## 2019-12-04 DIAGNOSIS — S52572A Other intraarticular fracture of lower end of left radius, initial encounter for closed fracture: Secondary | ICD-10-CM | POA: Diagnosis not present

## 2019-12-04 DIAGNOSIS — M1812 Unilateral primary osteoarthritis of first carpometacarpal joint, left hand: Secondary | ICD-10-CM | POA: Diagnosis not present

## 2019-12-14 ENCOUNTER — Other Ambulatory Visit: Payer: Self-pay | Admitting: Physician Assistant

## 2019-12-14 DIAGNOSIS — I10 Essential (primary) hypertension: Secondary | ICD-10-CM

## 2019-12-14 NOTE — Telephone Encounter (Signed)
Requested medication (s) are due for refill today: Yes  Requested medication (s) are on the active medication list: Yes  Last refill:  12/10/18  Future visit scheduled: No  Notes to clinic:  Prescription has expired.    Requested Prescriptions  Pending Prescriptions Disp Refills   lisinopril (ZESTRIL) 40 MG tablet [Pharmacy Med Name: LISINOPRIL 40 MG TABLET] 90 tablet 4    Sig: TAKE 1 TABLET BY MOUTH EVERY DAY      Cardiovascular:  ACE Inhibitors Failed - 12/14/2019  1:31 AM      Failed - Last BP in normal range    BP Readings from Last 1 Encounters:  10/17/19 (!) 188/76          Failed - Valid encounter within last 6 months    Recent Outpatient Visits           5 months ago Medicare annual wellness visit, subsequent   [facility], Kirstie Peri, MD   8 months ago Furuncle of finger, right   [facility], Vickki Muff, [place]   1 year ago Essential hypertension   [facility] [place], [place]   1 year ago Breast mass, left   [facility] Birdie Sons, MD   2 years ago Arthralgia, unspecified joint   [facility] Birdie Sons, MD              Passed - Cr in normal range and within 180 days    Creatinine  Date Value Ref Range Status  10/23/2013 0.81 0.60 - 1.30 mg/dL Final   Creatinine, Ser  Date Value Ref Range Status  07/10/2019 0.69 0.57 - 1.00 mg/dL Final          Passed - K in normal range and within 180 days    Potassium  Date Value Ref Range Status  07/10/2019 4.2 3.5 - 5.2 mmol/L Final  10/23/2013 4.1 3.5 - 5.1 mmol/L Final          Passed - Patient is not pregnant

## 2020-01-04 DIAGNOSIS — M65342 Trigger finger, left ring finger: Secondary | ICD-10-CM | POA: Diagnosis not present

## 2020-01-04 DIAGNOSIS — M1812 Unilateral primary osteoarthritis of first carpometacarpal joint, left hand: Secondary | ICD-10-CM | POA: Diagnosis not present

## 2020-01-04 DIAGNOSIS — S52572D Other intraarticular fracture of lower end of left radius, subsequent encounter for closed fracture with routine healing: Secondary | ICD-10-CM | POA: Diagnosis not present

## 2020-01-04 DIAGNOSIS — M25532 Pain in left wrist: Secondary | ICD-10-CM | POA: Diagnosis not present

## 2020-01-04 DIAGNOSIS — M65341 Trigger finger, right ring finger: Secondary | ICD-10-CM | POA: Diagnosis not present

## 2020-01-04 DIAGNOSIS — M19032 Primary osteoarthritis, left wrist: Secondary | ICD-10-CM | POA: Diagnosis not present

## 2020-01-04 DIAGNOSIS — W010XXD Fall on same level from slipping, tripping and stumbling without subsequent striking against object, subsequent encounter: Secondary | ICD-10-CM | POA: Diagnosis not present

## 2020-01-17 ENCOUNTER — Other Ambulatory Visit: Payer: Self-pay | Admitting: Family Medicine

## 2020-01-17 DIAGNOSIS — I10 Essential (primary) hypertension: Secondary | ICD-10-CM

## 2020-01-17 DIAGNOSIS — F419 Anxiety disorder, unspecified: Secondary | ICD-10-CM

## 2020-01-17 NOTE — Telephone Encounter (Signed)
Called pt and informed her that she needs to make a f/u OV for med refill. Pt stated that she didn't need any right now "I have 40 pills left." Pt aware she has no refills. Pt to call back to make appt. Requested Prescriptions  Pending Prescriptions Disp Refills  . amLODipine (NORVASC) 5 MG tablet [Pharmacy Med Name: AMLODIPINE BESYLATE 5 MG TAB] 90 tablet 3    Sig: TAKE 1 TABLET BY MOUTH EVERY DAY     Cardiovascular:  Calcium Channel Blockers Failed - 01/17/2020  5:49 PM      Failed - Last BP in normal range    BP Readings from Last 1 Encounters:  10/17/19 (!) 188/76         Failed - Valid encounter within last 6 months    Recent Outpatient Visits          6 months ago Medicare annual wellness visit, subsequent   [facility] Birdie Sons, MD   9 months ago Furuncle of finger, right   [facility], Vickki Muff, [place]   1 year ago Essential hypertension   [facility] Birdie Sons, MD   2 years ago Breast mass, left   [facility] Birdie Sons, MD   2 years ago Arthralgia, unspecified joint   [facility] Birdie Sons, MD

## 2020-01-17 NOTE — Telephone Encounter (Signed)
Requested medication (s) are due for refill today: yes  Requested medication (s) are on the active medication list: yes  Last refill:  06/30/19  Future visit scheduled: no  Notes to clinic:  Called pt and made her aware of need to make appt- pt stated she will call back to make appt. Medication not delegated to NT to RF   Requested Prescriptions  Pending Prescriptions Disp Refills   LORazepam (ATIVAN) 1 MG tablet [Pharmacy Med Name: LORAZEPAM 1 MG TABLET] 90 tablet     Sig: TAKE 1 TABLET BY MOUTH EVERY DAY      Not Delegated - Psychiatry:  Anxiolytics/Hypnotics Failed - 01/17/2020  6:18 PM      Failed - This refill cannot be delegated      Failed - Urine Drug Screen completed in last 360 days.      Failed - Valid encounter within last 6 months    Recent Outpatient Visits           6 months ago Medicare annual wellness visit, subsequent   [facility] Birdie Sons, MD   9 months ago Furuncle of finger, right   [facility], Vickki Muff, [place]   1 year ago Essential hypertension   [facility] Birdie Sons, MD   2 years ago Breast mass, left   [facility] Birdie Sons, MD   2 years ago Arthralgia, unspecified joint   [facility] Birdie Sons, MD

## 2020-02-15 NOTE — Progress Notes (Signed)
I,Roshena L Chambers,acting as a scribe for Lelon Huh, MD.,have documented all relevant documentation on the behalf of Lelon Huh, MD,as directed by  Lelon Huh, MD while in the presence of Lelon Huh, MD. Established patient visit   Patient: Christy Mills   DOB: 1934/08/11   86 y.o. Female  MRN: [id number] Visit Date: 02/16/2020  Today's healthcare provider: Lelon Huh, MD   Chief Complaint  Patient presents with  . Hyperlipidemia  . Hypertension   Subjective    HPI Hypertension, follow-up  BP Readings from Last 3 Encounters:  02/16/20 138/62  10/17/19 (!) 188/76  07/07/19 130/62   Wt Readings from Last 3 Encounters:  02/16/20 199 lb (90.3 kg)  10/17/19 200 lb (90.7 kg)  07/07/19 198 lb (89.8 kg)     She was last seen for hypertension 7 months ago.  BP at that visit was 130/62. Management since that visit includes no change. She reports good compliance with treatment. She is not having side effects.  She is exercising. She is adherent to low salt diet.   Outside blood pressures are not checked.  She does not smoke.  Use of agents associated with hypertension: NSAIDS.   --------------------------------------------------------------------------------------------------- Lipid/Cholesterol, follow-up  Last Lipid Panel: Lab Results  Component Value Date   CHOL 171 07/10/2019   LDLCALC 101 (H) 07/10/2019   HDL 43 07/10/2019   TRIG 153 (H) 07/10/2019    She was last seen for this 7 months ago.  Management since that visit includes no change.  She reports good compliance with treatment. She is not having side effects.   Symptoms: No appetite changes No foot ulcerations  No chest pain No chest pressure/discomfort  No dyspnea No orthopnea  No fatigue Yes lower extremity edema  No palpitations No paroxysmal nocturnal dyspnea  No nausea No numbness or tingling of extremity  No polydipsia No polyuria  No speech difficulty No syncope   She  is following a Regular diet. Current exercise: physical therapy exercise  Last metabolic panel  The ASCVD Risk score Mikey Bussing DC Jr., et al., 2013) failed to calculate for the following reasons:   The 2013 ASCVD risk score is only valid for ages 46 to 31  ---------------------------------------------------------------------------------------------------  Continues to take lorazepam every night which is working well to help sleep     Medications: Outpatient Medications Prior to Visit  Medication Sig  . acetaminophen (TYLENOL) 500 MG tablet Take 500 mg by mouth.   Marland Kitchen amLODipine (NORVASC) 5 MG tablet TAKE 1 TABLET BY MOUTH EVERY DAY  . aspirin EC 81 MG tablet Take 162 mg by mouth.   . calcium carbonate (OSCAL) 1500 (600 Ca) MG TABS tablet Take 600 mg of elemental calcium by mouth 2 (two) times daily with a meal.   . lisinopril (ZESTRIL) 40 MG tablet TAKE 1 TABLET BY MOUTH EVERY DAY  . LORazepam (ATIVAN) 1 MG tablet Take 1 tablet (1 mg total) by mouth at bedtime as needed.  . meloxicam (MOBIC) 7.5 MG tablet TAKE 1 TABLET (7.5 MG TOTAL) BY MOUTH DAILY AS NEEDED.  . Multiple Vitamins-Minerals (CENTRUM SILVER 50+WOMEN) TABS Take 1 tablet by mouth daily.  Marland Kitchen omeprazole (PRILOSEC) 20 MG capsule TAKE 1 CAPSULE BY MOUTH EVERY DAY  . simvastatin (ZOCOR) 20 MG tablet TAKE 1 TABLET BY MOUTH EVERY DAY  . Misc Natural Products (OSTEO BI-FLEX ADV DOUBLE ST PO) Take 1 tablet by mouth daily with lunch.  . [DISCONTINUED] traMADol (ULTRAM) 50 MG tablet Take 1 tablet (  50 mg total) by mouth every 6 (six) hours as needed for severe pain. (Patient not taking: Reported on 02/16/2020)   No facility-administered medications prior to visit.    Review of Systems  Constitutional: Negative for appetite change, chills, fatigue and fever.  Respiratory: Negative for chest tightness and shortness of breath.   Cardiovascular: Positive for leg swelling (right leg). Negative for chest pain and palpitations.    Gastrointestinal: Negative for abdominal pain, nausea and vomiting.  Neurological: Negative for dizziness and weakness.      Objective    BP 138/62   Pulse (!) 59   Temp (!) 97.5 F (36.4 C) (Temporal)   Resp 18   Wt 199 lb (90.3 kg)   SpO2 98% Comment: room air  BMI 34.16 kg/m    Physical Exam   General: Appearance:    Obese female in no acute distress  Eyes:    PERRL, conjunctiva/corneas clear, EOM's intact       Lungs:     Clear to auscultation bilaterally, respirations unlabored  Heart:    Bradycardic. Normal rhythm. 2/6 systolic murmur, rubs, or gallops.   MS:   All extremities are intact. 2+ bilateral edema.   Neurologic:   Awake, alert, oriented x 3. No apparent focal neurological           defect.      No results found for any visits on 02/16/20.  Assessment & Plan     1. Essential hypertension Well controlled.  Continue current medications.    2. Pure hypercholesterolemia She is tolerating simvastatin well with no adverse effects.    3. Bilateral edema of lower extremity Encouraged her to keep legs elevated and try OTC compression stockings.   4. Primary insomnia Doing well with lorazepam hs, Continue current medications.    5. History of transient ischemic attack (TIA) Continue 2 daily LD aspirin.    Follow up about 5 months.       The entirety of the information documented in the History of Present Illness, Review of Systems and Physical Exam were personally obtained by me. Portions of this information were initially documented by the CMA and reviewed by me for thoroughness and accuracy.      Lelon Huh, MD  [facility] [phone] (phone) [phone] (fax)  [place]

## 2020-02-16 ENCOUNTER — Ambulatory Visit (INDEPENDENT_AMBULATORY_CARE_PROVIDER_SITE_OTHER): Payer: PPO | Admitting: Family Medicine

## 2020-02-16 ENCOUNTER — Other Ambulatory Visit: Payer: Self-pay

## 2020-02-16 ENCOUNTER — Encounter: Payer: Self-pay | Admitting: Family Medicine

## 2020-02-16 VITALS — BP 138/62 | HR 59 | Temp 97.5°F | Resp 18 | Wt 199.0 lb

## 2020-02-16 DIAGNOSIS — I10 Essential (primary) hypertension: Secondary | ICD-10-CM | POA: Diagnosis not present

## 2020-02-16 DIAGNOSIS — Z8673 Personal history of transient ischemic attack (TIA), and cerebral infarction without residual deficits: Secondary | ICD-10-CM | POA: Diagnosis not present

## 2020-02-16 DIAGNOSIS — R600 Localized edema: Secondary | ICD-10-CM

## 2020-02-16 DIAGNOSIS — E7800 Pure hypercholesterolemia, unspecified: Secondary | ICD-10-CM | POA: Diagnosis not present

## 2020-02-16 DIAGNOSIS — F5101 Primary insomnia: Secondary | ICD-10-CM

## 2020-02-16 DIAGNOSIS — R011 Cardiac murmur, unspecified: Secondary | ICD-10-CM | POA: Insufficient documentation

## 2020-02-16 NOTE — Patient Instructions (Addendum)
.   Please review the attached list of medications and notify my office if there are any errors.   . Please bring all of your medications to every appointment so we can make sure that our medication list is the same as yours.    Please bring your Covid vaccine card to your next appointment so we can update your medical record

## 2020-02-19 ENCOUNTER — Other Ambulatory Visit: Payer: Self-pay | Admitting: Family Medicine

## 2020-02-19 DIAGNOSIS — F419 Anxiety disorder, unspecified: Secondary | ICD-10-CM

## 2020-03-08 ENCOUNTER — Other Ambulatory Visit: Payer: Self-pay | Admitting: Family Medicine

## 2020-03-08 DIAGNOSIS — I10 Essential (primary) hypertension: Secondary | ICD-10-CM

## 2020-03-08 NOTE — Telephone Encounter (Signed)
Requested Prescriptions  Pending Prescriptions Disp Refills   lisinopril (ZESTRIL) 40 MG tablet [Pharmacy Med Name: LISINOPRIL 40 MG TABLET] 90 tablet 1    Sig: TAKE 1 TABLET BY MOUTH EVERY DAY     Cardiovascular:  ACE Inhibitors Failed - 03/08/2020  1:22 AM      Failed - Cr in normal range and within 180 days    Creatinine  Date Value Ref Range Status  10/23/2013 0.81 0.60 - 1.30 mg/dL Final   Creatinine, Ser  Date Value Ref Range Status  07/10/2019 0.69 0.57 - 1.00 mg/dL Final         Failed - K in normal range and within 180 days    Potassium  Date Value Ref Range Status  07/10/2019 4.2 3.5 - 5.2 mmol/L Final  10/23/2013 4.1 3.5 - 5.1 mmol/L Final         Passed - Patient is not pregnant      Passed - Last BP in normal range    BP Readings from Last 1 Encounters:  02/16/20 138/62         Passed - Valid encounter within last 6 months    Recent Outpatient Visits          3 weeks ago Essential hypertension   [facility] Birdie Sons, MD   8 months ago Medicare annual wellness visit, subsequent   [facility] Birdie Sons, MD   11 months ago Furuncle of finger, right   [facility], Vickki Muff, [place]   1 year ago Essential hypertension   [facility] Birdie Sons, MD   2 years ago Breast mass, left   [facility] Birdie Sons, MD      Future Appointments            In 4 months Fisher, Kirstie Peri, MD [facility] [place] have been ordered and are pending.Patient seen by provider on 02/16/2020 with instructions for patient to continue current anti-hypertensive agents.  To f/u in 4 months.

## 2020-03-25 ENCOUNTER — Other Ambulatory Visit: Payer: Self-pay | Admitting: Family Medicine

## 2020-03-25 DIAGNOSIS — I10 Essential (primary) hypertension: Secondary | ICD-10-CM

## 2020-04-13 ENCOUNTER — Other Ambulatory Visit: Payer: Self-pay | Admitting: Family Medicine

## 2020-04-13 DIAGNOSIS — E785 Hyperlipidemia, unspecified: Secondary | ICD-10-CM

## 2020-04-13 NOTE — Telephone Encounter (Signed)
Requested Prescriptions  Pending Prescriptions Disp Refills  . simvastatin (ZOCOR) 20 MG tablet [Pharmacy Med Name: SIMVASTATIN 20 MG TABLET] 90 tablet 0    Sig: TAKE 1 TABLET BY MOUTH EVERY DAY     Cardiovascular:  Antilipid - Statins Failed - 04/13/2020  1:22 AM      Failed - LDL in normal range and within 360 days    LDL Chol Calc (NIH)  Date Value Ref Range Status  07/10/2019 101 (H) 0 - 99 mg/dL Final         Failed - Triglycerides in normal range and within 360 days    Triglycerides  Date Value Ref Range Status  07/10/2019 153 (H) 0 - 149 mg/dL Final         Passed - Total Cholesterol in normal range and within 360 days    Cholesterol, Total  Date Value Ref Range Status  07/10/2019 171 100 - 199 mg/dL Final         Passed - HDL in normal range and within 360 days    HDL  Date Value Ref Range Status  07/10/2019 43 >39 mg/dL Final         Passed - Patient is not pregnant      Passed - Valid encounter within last 12 months    Recent Outpatient Visits          1 month ago Essential hypertension   [facility] Birdie Sons, MD   9 months ago Medicare annual wellness visit, subsequent   [place], Kirstie Peri, MD   1 year ago Furuncle of finger, right   [facility], Vickki Muff, [place]   1 year ago Essential hypertension   [facility] Birdie Sons, MD   2 years ago Breast mass, left   [facility] Birdie Sons, MD      Future Appointments            In 3 months Fisher, Kirstie Peri, MD [facility] [place]

## 2020-04-28 DIAGNOSIS — M25561 Pain in right knee: Secondary | ICD-10-CM | POA: Diagnosis not present

## 2020-04-28 DIAGNOSIS — M7061 Trochanteric bursitis, right hip: Secondary | ICD-10-CM | POA: Diagnosis not present

## 2020-04-28 DIAGNOSIS — M7051 Other bursitis of knee, right knee: Secondary | ICD-10-CM | POA: Diagnosis not present

## 2020-04-28 DIAGNOSIS — Z96651 Presence of right artificial knee joint: Secondary | ICD-10-CM | POA: Diagnosis not present

## 2020-05-23 DIAGNOSIS — M79674 Pain in right toe(s): Secondary | ICD-10-CM | POA: Diagnosis not present

## 2020-05-23 DIAGNOSIS — B351 Tinea unguium: Secondary | ICD-10-CM | POA: Diagnosis not present

## 2020-05-23 DIAGNOSIS — M79675 Pain in left toe(s): Secondary | ICD-10-CM | POA: Diagnosis not present

## 2020-05-23 DIAGNOSIS — M898X9 Other specified disorders of bone, unspecified site: Secondary | ICD-10-CM | POA: Diagnosis not present

## 2020-05-23 DIAGNOSIS — D2372 Other benign neoplasm of skin of left lower limb, including hip: Secondary | ICD-10-CM | POA: Diagnosis not present

## 2020-05-30 DIAGNOSIS — M79675 Pain in left toe(s): Secondary | ICD-10-CM | POA: Diagnosis not present

## 2020-05-30 DIAGNOSIS — M898X9 Other specified disorders of bone, unspecified site: Secondary | ICD-10-CM | POA: Diagnosis not present

## 2020-06-07 ENCOUNTER — Other Ambulatory Visit: Payer: Self-pay | Admitting: Family Medicine

## 2020-06-07 DIAGNOSIS — M2550 Pain in unspecified joint: Secondary | ICD-10-CM

## 2020-07-07 ENCOUNTER — Telehealth: Payer: Self-pay

## 2020-07-07 DIAGNOSIS — M7989 Other specified soft tissue disorders: Secondary | ICD-10-CM

## 2020-07-07 MED ORDER — POTASSIUM CHLORIDE ER 10 MEQ PO TBCR: 10.0000 meq | EXTENDED_RELEASE_TABLET | Freq: Every day | ORAL | 1 refills | Status: DC

## 2020-07-07 MED ORDER — FUROSEMIDE 20 MG PO TABS: 20.0000 mg | ORAL_TABLET | Freq: Every day | ORAL | 1 refills | Status: DC | PRN

## 2020-07-07 NOTE — Telephone Encounter (Signed)
Copied from [place] [id number]. Topic: General - Other >> Jul 07, 2020 11:05 AM Jodie Echevaria wrote: Reason for CRM: Patient called to  inquire of Dr Caryn Section can he please send an Rx to the Pharmacy for her for furosemide (LASIX) 20 MG tablet say that she have swelling in her right leg. She also states that she was taking Klor Con Please advise Ph# [phone]

## 2020-07-09 ENCOUNTER — Other Ambulatory Visit: Payer: Self-pay | Admitting: Family Medicine

## 2020-07-09 DIAGNOSIS — E785 Hyperlipidemia, unspecified: Secondary | ICD-10-CM

## 2020-07-11 ENCOUNTER — Telehealth: Payer: Self-pay | Admitting: Family Medicine

## 2020-07-11 NOTE — Telephone Encounter (Signed)
Copied from [place] [id number]. Topic: Medicare AWV >> Jul 11, 2020  1:47 PM [name] wrote: Reason for CRM:   No answer unable to leave message for patient to call back and schedule Medicare Annual Wellness Visit (AWV) either virtually or in office.  Last AWV 07/07/2019  Please schedule at anytime with [facility] Health Advisor.  If any questions, please contact me at [phone]

## 2020-07-18 ENCOUNTER — Ambulatory Visit: Payer: Self-pay | Admitting: Family Medicine

## 2020-07-20 ENCOUNTER — Ambulatory Visit (INDEPENDENT_AMBULATORY_CARE_PROVIDER_SITE_OTHER): Payer: PPO | Admitting: Family Medicine

## 2020-07-20 ENCOUNTER — Other Ambulatory Visit: Payer: Self-pay

## 2020-07-20 ENCOUNTER — Encounter: Payer: Self-pay | Admitting: Family Medicine

## 2020-07-20 VITALS — BP 141/59 | HR 64 | Temp 98.7°F | Wt 191.0 lb

## 2020-07-20 DIAGNOSIS — F419 Anxiety disorder, unspecified: Secondary | ICD-10-CM | POA: Diagnosis not present

## 2020-07-20 DIAGNOSIS — I10 Essential (primary) hypertension: Secondary | ICD-10-CM | POA: Diagnosis not present

## 2020-07-20 DIAGNOSIS — Z23 Encounter for immunization: Secondary | ICD-10-CM

## 2020-07-20 DIAGNOSIS — E7800 Pure hypercholesterolemia, unspecified: Secondary | ICD-10-CM | POA: Diagnosis not present

## 2020-07-20 DIAGNOSIS — F5101 Primary insomnia: Secondary | ICD-10-CM | POA: Diagnosis not present

## 2020-07-20 NOTE — Patient Instructions (Signed)
Please review the attached list of medications and notify my office if there are any errors.   Please go to the lab draw station in [address] on the second floor of [facility]  when you are fasting for 8 hours. Normal hours are 8:00am to 11:30am and 1:00pm to 4:00pm Monday through Friday     

## 2020-07-20 NOTE — Progress Notes (Signed)
Established patient visit   Patient: Christy Mills   DOB: 1933/11/04   86 y.o. Female  MRN: [id number] Visit Date: 07/20/2020  Today's healthcare provider: Lelon Huh, MD   Chief Complaint  Patient presents with  . Hyperlipidemia  . Hypertension   Subjective    HPI  Hypertension, follow-up  BP Readings from Last 3 Encounters:  02/16/20 138/62  10/17/19 (!) 188/76  07/07/19 130/62   Wt Readings from Last 3 Encounters:  02/16/20 199 lb (90.3 kg)  10/17/19 200 lb (90.7 kg)  07/07/19 198 lb (89.8 kg)     She was last seen for hypertension 6 months ago.  BP at that visit was 138/62. Management since that visit includes no changes.  She reports excellent compliance with treatment. She is not having side effects.   She does not smoke.  Use of agents associated with hypertension: none.   Outside blood pressures are not being checked. Symptoms: No chest pain No chest pressure  No palpitations No syncope  No dyspnea No orthopnea  No paroxysmal nocturnal dyspnea No lower extremity edema   Pertinent labs: Lab Results  Component Value Date   CHOL 171 07/10/2019   HDL 43 07/10/2019   LDLCALC 101 (H) 07/10/2019   TRIG 153 (H) 07/10/2019   CHOLHDL 4.0 07/10/2019   Lab Results  Component Value Date   NA 143 07/10/2019   K 4.2 07/10/2019   CREATININE 0.69 07/10/2019   GFRNONAA 81 07/10/2019   GFRAA 93 07/10/2019   GLUCOSE 95 07/10/2019     The ASCVD Risk score (Goff DC Jr., et al., 2013) failed to calculate for the following reasons:   The 2013 ASCVD risk score is only valid for ages 44 to 3   --------------------------------------------------------------------------------------------------- Lipid/Cholesterol, Follow-up  Last lipid panel Other pertinent labs  Lab Results  Component Value Date   CHOL 171 07/10/2019   HDL 43 07/10/2019   LDLCALC 101 (H) 07/10/2019   TRIG 153 (H) 07/10/2019   CHOLHDL 4.0 07/10/2019   Lab Results  Component Value Date    ALT 18 07/10/2019   AST 18 07/10/2019   PLT 226 09/03/2018   TSH 1.290 07/10/2019     She was last seen for this 6 months ago.  Management since that visit includes no changes.  She reports excellent compliance with treatment. She is not having side effects.   Symptoms: No chest pain No chest pressure/discomfort  No dyspnea No lower extremity edema  No numbness or tingling of extremity No orthopnea  No palpitations No paroxysmal nocturnal dyspnea  No speech difficulty No syncope   Current diet: in general, a "healthy" diet     The ASCVD Risk score Mikey Bussing DC Jr., et al., 2013) failed to calculate for the following reasons:   The 2013 ASCVD risk score is only valid for ages 97 to 37  ---------------------------------------------------------------------------------------------------      Medications: Outpatient Medications Prior to Visit  Medication Sig  . acetaminophen (TYLENOL) 500 MG tablet Take 500 mg by mouth.   Marland Kitchen amLODipine (NORVASC) 5 MG tablet TAKE 1 TABLET BY MOUTH EVERY DAY  . aspirin EC 81 MG tablet Take 162 mg by mouth.   . calcium carbonate (OSCAL) 1500 (600 Ca) MG TABS tablet Take 600 mg of elemental calcium by mouth 2 (two) times daily with a meal.   . furosemide (LASIX) 20 MG tablet Take 1 tablet (20 mg total) by mouth daily as needed.  Marland Kitchen lisinopril (ZESTRIL) 40 MG  tablet TAKE 1 TABLET BY MOUTH EVERY DAY  . LORazepam (ATIVAN) 1 MG tablet TAKE 1 TABLET (1 MG TOTAL) BY MOUTH AT BEDTIME AS NEEDED.  Marland Kitchen meloxicam (MOBIC) 7.5 MG tablet TAKE 1 TABLET (7.5 MG TOTAL) BY MOUTH DAILY AS NEEDED.  . Multiple Vitamins-Minerals (CENTRUM SILVER 50+WOMEN) TABS Take 1 tablet by mouth daily.  Marland Kitchen omeprazole (PRILOSEC) 20 MG capsule TAKE 1 CAPSULE BY MOUTH EVERY DAY  . potassium chloride (KLOR-CON) 10 MEQ tablet Take 1 tablet (10 mEq total) by mouth daily. On days she takes furosemide  . simvastatin (ZOCOR) 20 MG tablet TAKE 1 TABLET BY MOUTH EVERY DAY   No facility-administered  medications prior to visit.    Review of Systems  Constitutional: Negative.   Respiratory: Negative.   Cardiovascular: Negative.   Gastrointestinal: Negative.   Neurological: Positive for headaches. Negative for dizziness and light-headedness.     Objective    BP (!) 141/59 (BP Location: Right Arm, Patient Position: Sitting, Cuff Size: Large)   Pulse 64   Temp 98.7 F (37.1 C) (Oral)   Wt 191 lb (86.6 kg)   SpO2 97%   BMI 32.79 kg/m    Physical Exam   General: Appearance:    Overweight female in no acute distress  Eyes:    PERRL, conjunctiva/corneas clear, EOM's intact       Lungs:     Clear to auscultation bilaterally, respirations unlabored  Heart:    Normal heart rate. Normal rhythm.  2/6 systolic murmur at right upper sternal border  MS:   All extremities are intact.   Neurologic:   Awake, alert, oriented x 3. No apparent focal neurological           defect.         Assessment & Plan     1. Primary hypertension Fairly well controlled. Continue current medications.   - CBC  2. Pure hypercholesterolemia She is tolerating simvastatin well with no adverse effects.   - Comprehensive metabolic panel - Lipid panel  3. Need for influenza vaccination  - Flu Vaccine QUAD High Dose(Fluad)  4. Primary insomnia Doing well with qhs lorazepam.   5. Anxiety Usually only has to take one lorazepam every day at bedtime, but occasionally needs to fill a few days early when only dispensed 30 tablets for 30 days. Will changed next prescription to permit occasional second tablet.   No follow-ups on file.      The entirety of the information documented in the History of Present Illness, Review of Systems and Physical Exam were personally obtained by me. Portions of this information were initially documented by the CMA and reviewed by me for thoroughness and accuracy.      Lelon Huh, MD  [facility] [phone] (phone) [phone] (fax)  [place]

## 2020-07-21 DIAGNOSIS — E7800 Pure hypercholesterolemia, unspecified: Secondary | ICD-10-CM | POA: Diagnosis not present

## 2020-07-21 DIAGNOSIS — I10 Essential (primary) hypertension: Secondary | ICD-10-CM | POA: Diagnosis not present

## 2020-07-22 LAB — LIPID PANEL
Chol/HDL Ratio: 3.4 ratio (ref 0.0–4.4)
Cholesterol, Total: 157 mg/dL (ref 100–199)
HDL: 46 mg/dL (ref >39)
LDL Chol Calc (NIH): 91 mg/dL (ref 0–99)
Triglycerides: 107 mg/dL (ref 0–149)
VLDL Cholesterol Cal: 20 mg/dL (ref 5–40)

## 2020-07-22 LAB — COMPREHENSIVE METABOLIC PANEL
ALT: 17 IU/L (ref 0–32)
AST: 17 IU/L (ref 0–40)
Albumin/Globulin Ratio: 1.5 (ref 1.2–2.2)
Albumin: 4.1 g/dL (ref 3.6–4.6)
Alkaline Phosphatase: 88 IU/L (ref 44–121)
BUN/Creatinine Ratio: 21 (ref 12–28)
BUN: 12 mg/dL (ref 8–27)
Bilirubin Total: 0.5 mg/dL (ref 0.0–1.2)
CO2: 25 mmol/L (ref 20–29)
Calcium: 9.2 mg/dL (ref 8.7–10.3)
Chloride: 103 mmol/L (ref 96–106)
Creatinine, Ser: 0.57 mg/dL (ref 0.57–1.00)
GFR calc Af Amer: 98 mL/min/{1.73_m2} (ref >59)
GFR calc non Af Amer: 85 mL/min/{1.73_m2} (ref >59)
Globulin, Total: 2.7 g/dL (ref 1.5–4.5)
Glucose: 93 mg/dL (ref 65–99)
Potassium: 3.6 mmol/L (ref 3.5–5.2)
Sodium: 141 mmol/L (ref 134–144)
Total Protein: 6.8 g/dL (ref 6.0–8.5)

## 2020-07-22 LAB — CBC
Hematocrit: 45.1 % (ref 34.0–46.6)
Hemoglobin: 14.9 g/dL (ref 11.1–15.9)
MCH: 30.7 pg (ref 26.6–33.0)
MCHC: 33 g/dL (ref 31.5–35.7)
MCV: 93 fL (ref 79–97)
Platelets: 196 10*3/uL (ref 150–450)
RBC: 4.85 x10E6/uL (ref 3.77–5.28)
RDW: 13 % (ref 11.7–15.4)
WBC: 6.2 10*3/uL (ref 3.4–10.8)

## 2020-07-31 ENCOUNTER — Other Ambulatory Visit: Payer: Self-pay | Admitting: Family Medicine

## 2020-07-31 DIAGNOSIS — M7989 Other specified soft tissue disorders: Secondary | ICD-10-CM

## 2020-07-31 NOTE — Telephone Encounter (Signed)
Requested Prescriptions  Pending Prescriptions Disp Refills   furosemide (LASIX) 20 MG tablet [Pharmacy Med Name: FUROSEMIDE 20 MG TABLET] 30 tablet 1    Sig: TAKE 1 TABLET BY MOUTH EVERY DAY AS NEEDED     Cardiovascular:  Diuretics - Loop Failed - 07/31/2020 11:31 AM      Failed - Last BP in normal range    BP Readings from Last 1 Encounters:  07/20/20 (!) 141/59         Passed - K in normal range and within 360 days    Potassium  Date Value Ref Range Status  07/21/2020 3.6 3.5 - 5.2 mmol/L Final  10/23/2013 4.1 3.5 - 5.1 mmol/L Final         Passed - Ca in normal range and within 360 days    Calcium  Date Value Ref Range Status  07/21/2020 9.2 8.7 - 10.3 mg/dL Final   Calcium, Total  Date Value Ref Range Status  10/23/2013 8.7 8.5 - 10.1 mg/dL Final         Passed - Na in normal range and within 360 days    Sodium  Date Value Ref Range Status  07/21/2020 141 134 - 144 mmol/L Final  10/23/2013 137 136 - 145 mmol/L Final         Passed - Cr in normal range and within 360 days    Creatinine  Date Value Ref Range Status  10/23/2013 0.81 0.60 - 1.30 mg/dL Final   Creatinine, Ser  Date Value Ref Range Status  07/21/2020 0.57 0.57 - 1.00 mg/dL Final         Passed - Valid encounter within last 6 months    Recent Outpatient Visits          1 week ago Primary hypertension   [facility] Birdie Sons, MD   5 months ago Essential hypertension   [facility] Birdie Sons, MD   1 year ago Medicare annual wellness visit, subsequent   [facility] Birdie Sons, MD   1 year ago Furuncle of finger, right   [employer], Vickki Muff, [place]   1 year ago Essential hypertension   [place], Kirstie Peri, MD

## 2020-07-31 NOTE — Telephone Encounter (Signed)
Requested Prescriptions  Pending Prescriptions Disp Refills   potassium chloride (KLOR-CON) 10 MEQ tablet [Pharmacy Med Name: POTASSIUM CL ER 10 MEQ TABLET] 90 tablet 0    Sig: TAKE 1 TABLET (10 MEQ TOTAL) BY MOUTH DAILY. ON DAYS SHE TAKES FUROSEMIDE     Endocrinology:  Minerals - Potassium Supplementation Passed - 07/31/2020  1:32 PM      Passed - K in normal range and within 360 days    Potassium  Date Value Ref Range Status  07/21/2020 3.6 3.5 - 5.2 mmol/L Final  10/23/2013 4.1 3.5 - 5.1 mmol/L Final         Passed - Cr in normal range and within 360 days    Creatinine  Date Value Ref Range Status  10/23/2013 0.81 0.60 - 1.30 mg/dL Final   Creatinine, Ser  Date Value Ref Range Status  07/21/2020 0.57 0.57 - 1.00 mg/dL Final         Passed - Valid encounter within last 12 months    Recent Outpatient Visits          1 week ago Primary hypertension   [facility] Birdie Sons, MD   5 months ago Essential hypertension   [facility] Birdie Sons, MD   1 year ago Medicare annual wellness visit, subsequent   [facility] Birdie Sons, MD   1 year ago Furuncle of finger, right   [place], Vickki Muff, [place]   1 year ago Essential hypertension   [place], Kirstie Peri, MD

## 2020-08-04 ENCOUNTER — Other Ambulatory Visit: Payer: Self-pay | Admitting: Family Medicine

## 2020-08-04 DIAGNOSIS — M7989 Other specified soft tissue disorders: Secondary | ICD-10-CM

## 2020-08-05 DIAGNOSIS — L03213 Periorbital cellulitis: Secondary | ICD-10-CM | POA: Diagnosis not present

## 2020-08-11 ENCOUNTER — Other Ambulatory Visit: Payer: Self-pay | Admitting: Family Medicine

## 2020-08-11 DIAGNOSIS — F419 Anxiety disorder, unspecified: Secondary | ICD-10-CM

## 2020-08-11 DIAGNOSIS — L03213 Periorbital cellulitis: Secondary | ICD-10-CM | POA: Diagnosis not present

## 2020-08-11 MED ORDER — LORAZEPAM 1 MG PO TABS: 1.0000 mg | ORAL_TABLET | Freq: Two times a day (BID) | ORAL | 5 refills | Status: DC | PRN

## 2020-09-09 ENCOUNTER — Other Ambulatory Visit: Payer: Self-pay | Admitting: Family Medicine

## 2020-09-09 DIAGNOSIS — I10 Essential (primary) hypertension: Secondary | ICD-10-CM

## 2020-09-18 ENCOUNTER — Other Ambulatory Visit: Payer: Self-pay | Admitting: Family Medicine

## 2020-09-18 DIAGNOSIS — I10 Essential (primary) hypertension: Secondary | ICD-10-CM

## 2020-10-26 ENCOUNTER — Other Ambulatory Visit: Payer: Self-pay | Admitting: Family Medicine

## 2020-10-26 DIAGNOSIS — M2550 Pain in unspecified joint: Secondary | ICD-10-CM

## 2020-10-26 NOTE — Telephone Encounter (Signed)
Requested Prescriptions  Pending Prescriptions Disp Refills  . meloxicam (MOBIC) 7.5 MG tablet [Pharmacy Med Name: MELOXICAM 7.5 MG TABLET] 90 tablet 1    Sig: TAKE 1 TABLET (7.5 MG TOTAL) BY MOUTH DAILY AS NEEDED.     Analgesics:  COX2 Inhibitors Passed - 10/26/2020  2:43 PM      Passed - HGB in normal range and within 360 days    Hemoglobin  Date Value Ref Range Status  07/21/2020 14.9 11.1 - 15.9 g/dL Final         Passed - Cr in normal range and within 360 days    Creatinine  Date Value Ref Range Status  10/23/2013 0.81 0.60 - 1.30 mg/dL Final   Creatinine, Ser  Date Value Ref Range Status  07/21/2020 0.57 0.57 - 1.00 mg/dL Final         Passed - Patient is not pregnant      Passed - Valid encounter within last 12 months    Recent Outpatient Visits          3 months ago Primary hypertension   [facility] Birdie Sons, MD   8 months ago Essential hypertension   [facility] Birdie Sons, MD   1 year ago Medicare annual wellness visit, subsequent   [facility] Birdie Sons, MD   1 year ago Furuncle of finger, right   [place], Vickki Muff, PA-C   2 years ago Essential hypertension   [place], Kirstie Peri, MD

## 2020-11-21 ENCOUNTER — Telehealth: Payer: Self-pay | Admitting: Family Medicine

## 2020-11-21 NOTE — Telephone Encounter (Signed)
Copied from [place] [id number]. Topic: Medicare AWV >> Nov 21, 2020  3:01 PM [name] wrote: Reason for CRM:  Left message for patient to call back and schedule Medicare Annual Wellness Visit (AWV) in office.   If not able to come in office, please offer to do virtually or by telephone.   Last AWV 07/07/2019  Please schedule at anytime with [facility] Health Advisor.  If any questions, please contact me at [phone]

## 2020-12-22 ENCOUNTER — Other Ambulatory Visit: Payer: Self-pay | Admitting: Family Medicine

## 2020-12-22 DIAGNOSIS — K219 Gastro-esophageal reflux disease without esophagitis: Secondary | ICD-10-CM

## 2020-12-22 NOTE — Telephone Encounter (Signed)
Requested Prescriptions  Pending Prescriptions Disp Refills  . omeprazole (PRILOSEC) 20 MG capsule [Pharmacy Med Name: OMEPRAZOLE DR 20 MG CAPSULE] 90 capsule 2    Sig: TAKE 1 CAPSULE BY MOUTH EVERY DAY     Gastroenterology: Proton Pump Inhibitors Passed - 12/22/2020  1:34 AM      Passed - Valid encounter within last 12 months    Recent Outpatient Visits          5 months ago Primary hypertension   [facility] Birdie Sons, MD   10 months ago Essential hypertension   [facility] Birdie Sons, MD   1 year ago Medicare annual wellness visit, subsequent   [facility] Birdie Sons, MD   1 year ago Furuncle of finger, right   [place], Vickki Muff, PA-C   2 years ago Essential hypertension   [facility] Birdie Sons, MD

## 2021-01-02 ENCOUNTER — Other Ambulatory Visit: Payer: Self-pay | Admitting: Family Medicine

## 2021-01-02 DIAGNOSIS — I10 Essential (primary) hypertension: Secondary | ICD-10-CM

## 2021-02-19 ENCOUNTER — Other Ambulatory Visit: Payer: Self-pay | Admitting: Family Medicine

## 2021-02-19 DIAGNOSIS — F419 Anxiety disorder, unspecified: Secondary | ICD-10-CM

## 2021-02-19 NOTE — Telephone Encounter (Signed)
Requested medication (s) are due for refill today: yes  Requested medication (s) are on the active medication list: yes  Last refill:  08/11/20  Future visit scheduled: no  Notes to clinic:  med not delegated to NT to RF Called pt and LM on VM to call office to make appointment   Requested Prescriptions  Pending Prescriptions Disp Refills   LORazepam (ATIVAN) 1 MG tablet [Pharmacy Med Name: LORAZEPAM 1 MG TABLET] 40 tablet     Sig: TAKE 1 TABLET BY MOUTH 2 TIMES DAILY AS NEEDED.      Not Delegated - Psychiatry:  Anxiolytics/Hypnotics Failed - 02/19/2021 12:27 PM      Failed - This refill cannot be delegated      Failed - Urine Drug Screen completed in last 360 days      Failed - Valid encounter within last 6 months    Recent Outpatient Visits           7 months ago Primary hypertension   [facility] Birdie Sons, MD   1 year ago Essential hypertension   [facility] Birdie Sons, MD   1 year ago Medicare annual wellness visit, subsequent   [facility] Birdie Sons, MD   1 year ago Furuncle of finger, right   [place], Vickki Muff, [place]   2 years ago Essential hypertension   [facility] Birdie Sons, MD

## 2021-04-08 ENCOUNTER — Other Ambulatory Visit: Payer: Self-pay | Admitting: Family Medicine

## 2021-04-08 DIAGNOSIS — E785 Hyperlipidemia, unspecified: Secondary | ICD-10-CM

## 2021-04-08 DIAGNOSIS — M2550 Pain in unspecified joint: Secondary | ICD-10-CM

## 2021-04-08 DIAGNOSIS — I10 Essential (primary) hypertension: Secondary | ICD-10-CM

## 2021-04-08 NOTE — Telephone Encounter (Signed)
Requested medication (s) are due for refill today   Yes  Requested medication (s) are on the active medication list   Yes  Last ordered 09/18/20  Future visit scheduled No. TC to patient-no answer and no VM.  LOV 07/20/20  Note to clinic-Patient needs appointment and unable to contact her or leave VM.      Requested Prescriptions  Pending Prescriptions Disp Refills   amLODipine (NORVASC) 5 MG tablet [Pharmacy Med Name: AMLODIPINE BESYLATE 5 MG TAB] 90 tablet 1    Sig: TAKE 1 TABLET BY MOUTH EVERY DAY      Cardiovascular:  Calcium Channel Blockers Failed - 04/08/2021  9:53 AM      Failed - Last BP in normal range    BP Readings from Last 1 Encounters:  07/20/20 (!) 141/59          Failed - Valid encounter within last 6 months    Recent Outpatient Visits           8 months ago Primary hypertension   [facility] Birdie Sons, MD   1 year ago Essential hypertension   [facility] Birdie Sons, MD   1 year ago Medicare annual wellness visit, subsequent   [facility] Birdie Sons, MD   2 years ago Furuncle of finger, right   [employer], Vickki Muff, PA-C   2 years ago Essential hypertension   [facility], Kirstie Peri, MD                  lisinopril (ZESTRIL) 40 MG tablet [Pharmacy Med Name: LISINOPRIL 40 MG TABLET] 90 tablet 1    Sig: TAKE 1 TABLET BY MOUTH EVERY DAY      Cardiovascular:  ACE Inhibitors Failed - 04/08/2021  9:53 AM      Failed - Cr in normal range and within 180 days    Creatinine  Date Value Ref Range Status  10/23/2013 0.81 0.60 - 1.30 mg/dL Final   Creatinine, Ser  Date Value Ref Range Status  07/21/2020 0.57 0.57 - 1.00 mg/dL Final          Failed - K in normal range and within 180 days    Potassium  Date Value Ref Range Status  07/21/2020 3.6 3.5 - 5.2 mmol/L Final  10/23/2013 4.1 3.5 - 5.1 mmol/L Final          Failed - Last BP in  normal range    BP Readings from Last 1 Encounters:  07/20/20 (!) 141/59          Failed - Valid encounter within last 6 months    Recent Outpatient Visits           8 months ago Primary hypertension   [facility] Birdie Sons, MD   1 year ago Essential hypertension   [facility] Birdie Sons, MD   1 year ago Medicare annual wellness visit, subsequent   [facility] Birdie Sons, MD   2 years ago Furuncle of finger, right   [employer], Vickki Muff, PA-C   2 years ago Essential hypertension   [place], Kirstie Peri, MD                Passed - Patient is not pregnant        simvastatin (ZOCOR) 20 MG tablet [Pharmacy Med Name: SIMVASTATIN 20 MG TABLET] 90 tablet 1  Sig: TAKE 1 TABLET BY MOUTH EVERY DAY      Cardiovascular:  Antilipid - Statins Passed - 04/08/2021  9:53 AM      Passed - Total Cholesterol in normal range and within 360 days    Cholesterol, Total  Date Value Ref Range Status  07/21/2020 157 100 - 199 mg/dL Final          Passed - LDL in normal range and within 360 days    LDL Chol Calc (NIH)  Date Value Ref Range Status  07/21/2020 91 0 - 99 mg/dL Final          Passed - HDL in normal range and within 360 days    HDL  Date Value Ref Range Status  07/21/2020 46 >39 mg/dL Final          Passed - Triglycerides in normal range and within 360 days    Triglycerides  Date Value Ref Range Status  07/21/2020 107 0 - 149 mg/dL Final          Passed - Patient is not pregnant      Passed - Valid encounter within last 12 months    Recent Outpatient Visits           8 months ago Primary hypertension   [facility] Birdie Sons, MD   1 year ago Essential hypertension   [facility] Birdie Sons, MD   1 year ago Medicare annual wellness visit, subsequent   [facility] Birdie Sons, MD   2 years  ago Furuncle of finger, right   [employer], Vickki Muff, PA-C   2 years ago Essential hypertension   [facility], Kirstie Peri, MD                  meloxicam (MOBIC) 7.5 MG tablet [Pharmacy Med Name: MELOXICAM 7.5 MG TABLET] 90 tablet 1    Sig: TAKE 1 TABLET BY MOUTH DAILY AS NEEDED.      Analgesics:  COX2 Inhibitors Passed - 04/08/2021  9:53 AM      Passed - HGB in normal range and within 360 days    Hemoglobin  Date Value Ref Range Status  07/21/2020 14.9 11.1 - 15.9 g/dL Final          Passed - Cr in normal range and within 360 days    Creatinine  Date Value Ref Range Status  10/23/2013 0.81 0.60 - 1.30 mg/dL Final   Creatinine, Ser  Date Value Ref Range Status  07/21/2020 0.57 0.57 - 1.00 mg/dL Final          Passed - Patient is not pregnant      Passed - Valid encounter within last 12 months    Recent Outpatient Visits           8 months ago Primary hypertension   [facility] Birdie Sons, MD   1 year ago Essential hypertension   [facility] Birdie Sons, MD   1 year ago Medicare annual wellness visit, subsequent   [facility] Birdie Sons, MD   2 years ago Furuncle of finger, right   [place], Vickki Muff, PA-C   2 years ago Essential hypertension   [place], Kirstie Peri, MD

## 2021-04-16 ENCOUNTER — Other Ambulatory Visit: Payer: Self-pay | Admitting: Family Medicine

## 2021-04-16 DIAGNOSIS — F419 Anxiety disorder, unspecified: Secondary | ICD-10-CM

## 2021-04-17 NOTE — Telephone Encounter (Signed)
Requested medication (s) are due for refill today: Yes  Requested medication (s) are on the active medication list: Yes  Last refill:  02/20/21  Future visit scheduled: Yes  05/22/21  Notes to clinic:  See request.    Requested Prescriptions  Pending Prescriptions Disp Refills   LORazepam (ATIVAN) 1 MG tablet [Pharmacy Med Name: LORAZEPAM 1 MG TABLET] 40 tablet     Sig: TAKE 1 TABLET BY MOUTH TWICE A DAY AS NEEDED      Not Delegated - Psychiatry:  Anxiolytics/Hypnotics Failed - 04/16/2021  2:07 PM      Failed - This refill cannot be delegated      Failed - Urine Drug Screen completed in last 360 days      Failed - Valid encounter within last 6 months    Recent Outpatient Visits           9 months ago Primary hypertension   [facility] Birdie Sons, MD   1 year ago Essential hypertension   [facility] Birdie Sons, MD   1 year ago Medicare annual wellness visit, subsequent   [facility] Birdie Sons, MD   2 years ago Furuncle of finger, right   [employer], Vickki Muff, PA-C   2 years ago Essential hypertension   [place], Kirstie Peri, MD       Future Appointments             In 1 month [place], Kirstie Peri, MD [facility] [place]

## 2021-04-23 ENCOUNTER — Encounter: Payer: Self-pay | Admitting: Emergency Medicine

## 2021-04-23 ENCOUNTER — Emergency Department: Payer: PPO

## 2021-04-23 ENCOUNTER — Emergency Department
Admission: EM | Admit: 2021-04-23 | Discharge: 2021-04-23 | Disposition: A | Discharge status: Home / Self Care | Payer: PPO | Attending: Emergency Medicine | Admitting: Emergency Medicine

## 2021-04-23 ENCOUNTER — Other Ambulatory Visit: Payer: Self-pay

## 2021-04-23 DIAGNOSIS — Z7982 Long term (current) use of aspirin: Secondary | ICD-10-CM | POA: Diagnosis not present

## 2021-04-23 DIAGNOSIS — S79911A Unspecified injury of right hip, initial encounter: Secondary | ICD-10-CM | POA: Diagnosis present

## 2021-04-23 DIAGNOSIS — S7001XA Contusion of right hip, initial encounter: Secondary | ICD-10-CM | POA: Diagnosis not present

## 2021-04-23 DIAGNOSIS — J320 Chronic maxillary sinusitis: Secondary | ICD-10-CM | POA: Diagnosis not present

## 2021-04-23 DIAGNOSIS — W19XXXA Unspecified fall, initial encounter: Secondary | ICD-10-CM

## 2021-04-23 DIAGNOSIS — Z79899 Other long term (current) drug therapy: Secondary | ICD-10-CM | POA: Diagnosis not present

## 2021-04-23 DIAGNOSIS — Z96651 Presence of right artificial knee joint: Secondary | ICD-10-CM | POA: Insufficient documentation

## 2021-04-23 DIAGNOSIS — I6782 Cerebral ischemia: Secondary | ICD-10-CM | POA: Diagnosis not present

## 2021-04-23 DIAGNOSIS — I10 Essential (primary) hypertension: Secondary | ICD-10-CM | POA: Insufficient documentation

## 2021-04-23 DIAGNOSIS — M47812 Spondylosis without myelopathy or radiculopathy, cervical region: Secondary | ICD-10-CM | POA: Diagnosis not present

## 2021-04-23 DIAGNOSIS — Z87891 Personal history of nicotine dependence: Secondary | ICD-10-CM | POA: Diagnosis not present

## 2021-04-23 DIAGNOSIS — W1839XA Other fall on same level, initial encounter: Secondary | ICD-10-CM | POA: Insufficient documentation

## 2021-04-23 DIAGNOSIS — Z20822 Contact with and (suspected) exposure to covid-19: Secondary | ICD-10-CM | POA: Diagnosis not present

## 2021-04-23 DIAGNOSIS — M25551 Pain in right hip: Secondary | ICD-10-CM | POA: Diagnosis not present

## 2021-04-23 DIAGNOSIS — Z85828 Personal history of other malignant neoplasm of skin: Secondary | ICD-10-CM | POA: Insufficient documentation

## 2021-04-23 DIAGNOSIS — R42 Dizziness and giddiness: Secondary | ICD-10-CM | POA: Diagnosis not present

## 2021-04-23 DIAGNOSIS — R222 Localized swelling, mass and lump, trunk: Secondary | ICD-10-CM | POA: Diagnosis not present

## 2021-04-23 DIAGNOSIS — S199XXA Unspecified injury of neck, initial encounter: Secondary | ICD-10-CM | POA: Diagnosis not present

## 2021-04-23 DIAGNOSIS — M4312 Spondylolisthesis, cervical region: Secondary | ICD-10-CM | POA: Diagnosis not present

## 2021-04-23 DIAGNOSIS — S0990XA Unspecified injury of head, initial encounter: Secondary | ICD-10-CM | POA: Diagnosis not present

## 2021-04-23 DIAGNOSIS — R0902 Hypoxemia: Secondary | ICD-10-CM | POA: Diagnosis not present

## 2021-04-23 LAB — CBC WITH DIFFERENTIAL/PLATELET
Abs Immature Granulocytes: 0.04 10*3/uL (ref 0.00–0.07)
Basophils Absolute: 0 10*3/uL (ref 0.0–0.1)
Basophils Relative: 0 %
Eosinophils Absolute: 0 10*3/uL (ref 0.0–0.5)
Eosinophils Relative: 0 %
HCT: 47 % — ABNORMAL HIGH (ref 36.0–46.0)
Hemoglobin: 15.4 g/dL — ABNORMAL HIGH (ref 12.0–15.0)
Immature Granulocytes: 0 %
Lymphocytes Relative: 7 %
Lymphs Abs: 0.7 10*3/uL (ref 0.7–4.0)
MCH: 31.3 pg (ref 26.0–34.0)
MCHC: 32.8 g/dL (ref 30.0–36.0)
MCV: 95.5 fL (ref 80.0–100.0)
Monocytes Absolute: 0.6 10*3/uL (ref 0.1–1.0)
Monocytes Relative: 6 %
Neutro Abs: 8.3 10*3/uL — ABNORMAL HIGH (ref 1.7–7.7)
Neutrophils Relative %: 87 %
Platelets: UNDETERMINED 10*3/uL (ref 150–400)
RBC: 4.92 MIL/uL (ref 3.87–5.11)
RDW: 13.2 % (ref 11.5–15.5)
WBC: 9.7 10*3/uL (ref 4.0–10.5)
nRBC: 0 % (ref 0.0–0.2)

## 2021-04-23 LAB — COMPREHENSIVE METABOLIC PANEL
ALT: 17 U/L (ref 0–44)
AST: 30 U/L (ref 15–41)
Albumin: 3.4 g/dL — ABNORMAL LOW (ref 3.5–5.0)
Alkaline Phosphatase: 67 U/L (ref 38–126)
Anion gap: 9 (ref 5–15)
BUN: 10 mg/dL (ref 8–23)
CO2: 22 mmol/L (ref 22–32)
Calcium: 8.3 mg/dL — ABNORMAL LOW (ref 8.9–10.3)
Chloride: 108 mmol/L (ref 98–111)
Creatinine, Ser: 0.6 mg/dL (ref 0.44–1.00)
GFR, Estimated: >60 mL/min (ref >60)
Glucose, Bld: 106 mg/dL — ABNORMAL HIGH (ref 70–99)
Potassium: 3.9 mmol/L (ref 3.5–5.1)
Sodium: 139 mmol/L (ref 135–145)
Total Bilirubin: 1 mg/dL (ref 0.3–1.2)
Total Protein: 6.3 g/dL — ABNORMAL LOW (ref 6.5–8.1)

## 2021-04-23 LAB — URINALYSIS, COMPLETE (UACMP) WITH MICROSCOPIC
Bilirubin Urine: NEGATIVE
Glucose, UA: NEGATIVE mg/dL
Hgb urine dipstick: NEGATIVE
Ketones, ur: NEGATIVE mg/dL
Leukocytes,Ua: NEGATIVE
Nitrite: NEGATIVE
Protein, ur: NEGATIVE mg/dL
Specific Gravity, Urine: 1.005 (ref 1.005–1.030)
pH: 8 (ref 5.0–8.0)

## 2021-04-23 LAB — RESP PANEL BY RT-PCR (FLU A&B, COVID) ARPGX2
Influenza A by PCR: NEGATIVE
Influenza B by PCR: NEGATIVE
SARS Coronavirus 2 by RT PCR: NEGATIVE

## 2021-04-23 LAB — CK: Total CK: 280 U/L — ABNORMAL HIGH (ref 38–234)

## 2021-04-23 LAB — MAGNESIUM: Magnesium: 1.9 mg/dL (ref 1.7–2.4)

## 2021-04-23 LAB — TROPONIN I (HIGH SENSITIVITY): Troponin I (High Sensitivity): 9 ng/L (ref <18)

## 2021-04-23 MED ORDER — ACETAMINOPHEN 500 MG PO TABS
1000.0000 mg | ORAL_TABLET | Freq: Once | ORAL | Status: AC
  Administered 2021-04-23: 1000 mg via ORAL
  Filled 2021-04-23: qty 2

## 2021-04-23 NOTE — ED Provider Notes (Signed)
[facility] Emergency Department Provider Note  ____________________________________________   Event Date/Time   First MD Initiated Contact with Patient 04/23/21 1053     (approximate)  I have reviewed the triage vital signs and the nursing notes.   HISTORY  Chief Complaint Fall   HPI Christy Mills is a 87 y.o. female with a past medical history of HTN, HDL, osteoporosis, arthritis, anxiety and basal cell carcinoma status post Mohs surgery who presents EMS from home for assessment after a fall.  Patient states she was getting out of bed to use the bathroom when she felt little dizzy and fell backwards onto her right hip.  She states she thinks he pulled a muscle x-ray some soreness in her right hip.  She does not think she hit her head and does not have any headache or neck pain.  She did not have any LOC.  She is not on any blood thinners.  She denies any chest pain, cough, vomiting, diarrhea or any other acute sick symptoms or recent falls.  She has no other extremity pain.  Denies any other acute concerns at this time.  She does note she uses a cane to get around her home.         Past Medical History:  Diagnosis Date   Anxiety 1980   difficulty sleeping after mother died.   Arthritis    Basal cell carcinoma (BCC) of skin of right upper lip    S/P MOHS Dr. Ishmael Holter [facility] [place] 10/24/2018   Cancer ([place])    skin resected from nose in 2014.   History of trigger finger    Hyperlipidemia    Hypertension    Osteoporosis     Patient Active Problem List   Diagnosis Date Noted   Bilateral edema of lower extremity 123456   Systolic murmur 123456   History of transient ischemic attack (TIA) 02/16/2020   HOH (hard of hearing) 07/07/2019   Primary insomnia 07/07/2019   Primary osteoarthritis of left knee 07/24/2017   Thyroid nodule 02/23/2017   Acid reflux 12/19/2015   Urinary frequency 12/19/2015   Arthritis 09/12/2015   BP (high blood  pressure) 09/12/2015   Anxiety 05/26/2015   Hyperlipemia 03/24/2015   Osteoporosis 03/24/2015    Past Surgical History:  Procedure Laterality Date   APPENDECTOMY  1978   CATARACT EXTRACTION W/ INTRAOCULAR LENS IMPLANT Bilateral 2004   one eye than the other a month laster   HYSTEROSCOPY WITH D & C N/A 08/10/2016   Procedure: DILATATION AND CURETTAGE /HYSTEROSCOPY;  Surgeon: Honor Loh Ward, MD;  Location: [facility] ORS;  Service: Gynecology;  Laterality: N/A;   JOINT REPLACEMENT Right 2015   MOHS SURGERY  2014   nose   MOHS SURGERY Right 10/24/2018   upper lip Ishmael Holter [facility] 10/24/2018   OOPHORECTOMY Right    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 Authorizing Provider  acetaminophen (TYLENOL) 500 MG tablet Take 500 mg by mouth.     [provider]  amLODipine (NORVASC) 5 MG tablet TAKE 1 TABLET BY MOUTH EVERY DAY 04/09/21   Birdie Sons, MD  aspirin EC 81 MG tablet Take 162 mg by mouth.     [provider]  calcium carbonate (OSCAL) 1500 (600 Ca) MG TABS tablet Take 600 mg of elemental calcium by mouth  2 (two) times daily with a meal.     [provider]  furosemide (LASIX) 20 MG tablet TAKE 1 TABLET BY MOUTH EVERY DAY AS NEEDED 07/31/20   Birdie Sons, MD  lisinopril (ZESTRIL) 40 MG tablet TAKE 1 TABLET BY MOUTH EVERY DAY 04/09/21   Birdie Sons, MD  LORazepam (ATIVAN) 1 MG tablet TAKE 1 TABLET BY MOUTH TWICE A DAY AS NEEDED 04/18/21   Birdie Sons, MD  meloxicam (MOBIC) 7.5 MG tablet TAKE 1 TABLET BY MOUTH DAILY AS NEEDED. 04/09/21   Birdie Sons, MD  Multiple Vitamins-Minerals (CENTRUM SILVER 50+WOMEN) TABS Take 1 tablet by mouth daily.    [provider]  omeprazole (PRILOSEC) 20 MG capsule TAKE 1 CAPSULE BY MOUTH EVERY DAY 12/22/20   Birdie Sons, MD  potassium chloride (KLOR-CON)  10 MEQ tablet TAKE 1 TABLET (10 MEQ TOTAL) BY MOUTH DAILY. ON DAYS SHE TAKES FUROSEMIDE 07/31/20   Birdie Sons, MD  simvastatin (ZOCOR) 20 MG tablet TAKE 1 TABLET BY MOUTH EVERY DAY 04/09/21   Birdie Sons, MD    Allergies Ciprofloxacin hcl  Family History  Problem Relation Age of Onset   Breast cancer Mother 92   Hypertension Mother    Diabetes Father        type 2   Hypertension Father    Cancer Father        lung   Colon polyps Father    Cancer Brother        brain   Diabetes Brother        type 2    Social History Social History   Tobacco Use   Smoking status: Former    Types: Cigarettes    Quit date: 08/01/1991    Years since quitting: 29.7   Smokeless tobacco: Never   Tobacco comments:    quit in 1992  Substance Use Topics   Alcohol use: No    Alcohol/week: 0.0 standard drinks   Drug use: No    Review of Systems  Review of Systems  Constitutional:  Negative for chills and fever.  HENT:  Negative for sore throat.   Eyes:  Negative for pain.  Respiratory:  Negative for cough and stridor.   Cardiovascular:  Negative for chest pain.  Gastrointestinal:  Negative for vomiting.  Genitourinary:  Negative for dysuria.  Musculoskeletal:  Positive for joint pain (R hip) and myalgias (R hip).  Skin:  Negative for rash.  Neurological:  Positive for dizziness. Negative for seizures, loss of consciousness and headaches.  Psychiatric/Behavioral:  Negative for suicidal ideas.   All other systems reviewed and are negative.    ____________________________________________   PHYSICAL EXAM:  VITAL SIGNS: ED Triage Vitals  Enc Vitals Group     BP      Pulse      Resp      Temp      Temp src      SpO2      Weight      Height      Head Circumference      Peak Flow      Pain Score      Pain Loc      Pain Edu?      Excl. in [place]?    Vitals:   04/23/21 1358 04/23/21 1405  BP:  (!) 166/67  Pulse:  (!) 59  Resp:  18  Temp: (!) 97.5 F (36.4 C)    SpO2:  95%  Physical Exam Vitals and nursing note reviewed.  Constitutional:      General: She is not in acute distress.    Appearance: She is well-developed.  HENT:     Head: Normocephalic and atraumatic.     Right Ear: External ear normal.     Left Ear: External ear normal.     Nose: Nose normal.  Eyes:     Conjunctiva/sclera: Conjunctivae normal.  Cardiovascular:     Rate and Rhythm: Normal rate and regular rhythm.     Heart sounds: No murmur heard. Pulmonary:     Effort: Pulmonary effort is normal. No respiratory distress.     Breath sounds: Normal breath sounds.  Abdominal:     Palpations: Abdomen is soft.     Tenderness: There is no abdominal tenderness.  Musculoskeletal:     Cervical back: Neck supple.     Right lower leg: Edema present.     Left lower leg: Edema present.  Skin:    General: Skin is warm and dry.     Capillary Refill: Capillary refill takes less than 2 seconds.  Neurological:     Mental Status: She is alert and oriented to person, place, and time.    Patient is very hard of hearing.  Cranial nerves II through XII grossly intact.  Patient has symmetric strength in upper extremities and throughout her left lower extremity.  She is little bit weaker on flexion extension of the right hip although is able to elevate her right leg off the bed but with less joint compared to the left.  She is able to flex and extend the right knee and dorsi and plantarflex the right ankle with symmetric strength compared to the left.  2+ radial and DP pulses.  Bilateral lower extremity edema.  No tenderness step-offs or deformities over the C/T/L-spine.  No areas of point tenderness effusion or deformity over the bilateral shoulders, elbows, wrists, knees or ankles. ____________________________________________   LABS (all labs ordered are listed, but only abnormal results are displayed)  Labs Reviewed  CBC WITH DIFFERENTIAL/PLATELET - Abnormal; Notable for the following  components:      Result Value   Hemoglobin 15.4 (*)    HCT 47.0 (*)    Neutro Abs 8.3 (*)    All other components within normal limits  COMPREHENSIVE METABOLIC PANEL - Abnormal; Notable for the following components:   Glucose, Bld 106 (*)    Calcium 8.3 (*)    Total Protein 6.3 (*)    Albumin 3.4 (*)    All other components within normal limits  URINALYSIS, COMPLETE (UACMP) WITH MICROSCOPIC - Abnormal; Notable for the following components:   Color, Urine STRAW (*)    APPearance CLEAR (*)    Bacteria, UA RARE (*)    All other components within normal limits  CK - Abnormal; Notable for the following components:   Total CK 280 (*)    All other components within normal limits  RESP PANEL BY RT-PCR (FLU A&B, COVID) ARPGX2  MAGNESIUM  TROPONIN I (HIGH SENSITIVITY)  TROPONIN I (HIGH SENSITIVITY)   ____________________________________________  EKG  Sinus rhythm with ventricular to 74, right bundle branch block, normal axis, QTc interval of 510 without other clearance of acute ischemia or significant arrhythmia. ____________________________________________  RADIOLOGY  ED MD interpretation: CT head shows chronic sinusitis and chronic small vessel ischemic changes and cerebral white matter without any evidence of acute intracranial process.  No evidence of mass-effect, hemorrhage or subacute CVA.  CT C-spine  shows no acute fracture or subluxation.  There are some degenerative changes.  Chest x-ray shows no focal consolidation, effusion, pneumothorax, rib fracture or any other acute intrathoracic process.  Film of the right hip shows some degenerative changes but no fracture or dislocation.  Official radiology report(s): CT Head Wo Contrast  Result Date: 04/23/2021 CLINICAL DATA:  Head trauma, moderate-severe.  Fall at home. EXAM: CT HEAD WITHOUT CONTRAST TECHNIQUE: Contiguous axial images were obtained from the base of the skull through the vertex without intravenous contrast. COMPARISON:   None. FINDINGS: Brain: Nonspecific moderate subcortical and periventricular white matter hypodensity, most in keeping with chronic small vessel ischemic change. No evidence of parenchymal hemorrhage or extra-axial fluid collection. No mass lesion, mass effect, or midline shift. No CT evidence of acute infarction. Cerebral volume is age appropriate. No ventriculomegaly. Vascular: No acute abnormality. Skull: No evidence of calvarial fracture. Sinuses/Orbits: Near complete opacification of the right maxillary sinus with asymmetric mild hyperostosis in the right maxillary sinus wall. No fluid levels. Other:  The mastoid air cells are unopacified. IMPRESSION: 1. No evidence of acute intracranial abnormality. No evidence of calvarial fracture. 2. Moderate chronic small vessel ischemic changes in the cerebral white matter. 3. Chronic right maxillary sinusitis. Electronically Signed   By: Ilona Sorrel M.D.   On: 04/23/2021 11:56   CT Cervical Spine Wo Contrast  Result Date: 04/23/2021 CLINICAL DATA:  Neck trauma. Fall at home. Right lower extremity pain. EXAM: CT CERVICAL SPINE WITHOUT CONTRAST TECHNIQUE: Multidetector CT imaging of the cervical spine was performed without intravenous contrast. Multiplanar CT image reconstructions were also generated. COMPARISON:  None. FINDINGS: Alignment: Mild straightening of the cervical spine. No facet subluxation. Dens is well positioned between the lateral masses of C1. Minimal 2 mm anterolisthesis at C6-7. Skull base and vertebrae: No acute fracture. No primary bone lesion or focal pathologic process. Soft tissues and spinal canal: No prevertebral edema. No visible canal hematoma. Disc levels: Moderate to marked multilevel degenerative disc disease in cervical spine, most prominent at C6-7. Advanced bilateral facet arthropathy. No significant degenerative foraminal stenosis. Upper chest: No acute abnormality. Other: Visualized mastoid air cells appear clear. No discrete  thyroid nodules. No pathologically enlarged cervical nodes. IMPRESSION: 1. No cervical spine fracture or facet subluxation. 2. Moderate to marked multilevel degenerative changes in the cervical spine as detailed. 3. Minimal 2 mm anterolisthesis at C6-7, probably degenerative. Electronically Signed   By: Ilona Sorrel M.D.   On: 04/23/2021 12:07   DG Chest Portable 1 View  Result Date: 04/23/2021 CLINICAL DATA:  Fall, dizziness. EXAM: PORTABLE CHEST 1 VIEW COMPARISON:  None. FINDINGS: The heart size and mediastinal contours are within normal limits. Both lungs are clear. The visualized skeletal structures are unremarkable. IMPRESSION: No active disease. Electronically Signed   By: Marijo Conception M.D.   On: 04/23/2021 12:13   DG Hip Unilat W or Wo Pelvis 2-3 Views Right  Result Date: 04/23/2021 CLINICAL DATA:  Right hip pain after fall last night. EXAM: DG HIP (WITH OR WITHOUT PELVIS) 2-3V RIGHT COMPARISON:  None. FINDINGS: There is no evidence of hip fracture or dislocation. Moderate osteophyte formation is noted. No significant joint space narrowing is noted. IMPRESSION: Moderate degenerative joint disease of the right hip. No acute abnormality seen. Electronically Signed   By: Marijo Conception M.D.   On: 04/23/2021 12:15    ____________________________________________   PROCEDURES  Procedure(s) performed (including Critical Care):  .1-3 Lead EKG Interpretation  Date/Time: 04/23/2021 2:36 PM Performed  by: Lucrezia Starch, MD Authorized by: Lucrezia Starch, MD     Interpretation: normal     ECG rate assessment: normal     Rhythm: sinus rhythm     Ectopy: none     Conduction: normal     ____________________________________________   INITIAL IMPRESSION / ASSESSMENT AND PLAN / ED COURSE      Patient presents with above to history exam for assessment after a fall proceed with some dizziness.  On arrival she is hypertensive with a BP of 153/77 with otherwise stable vital signs on room  air.  On exam.  Some mild tenderness and decreased into the right hip but otherwise denies any other acute pain and has no other findings on exam suggestive of significant trauma.  She is otherwise neurovascularly intact without other focal deficits.  She denies any recent sick symptoms.  With regard to pain in her right hip differential includes hip fracture, contusion hematoma and muscle strain.     CT head shows chronic sinusitis and chronic small vessel ischemic changes and cerebral white matter without any evidence of acute intracranial process.  No evidence of mass-effect, hemorrhage or subacute CVA.  CT C-spine shows no acute fracture or subluxation.  There are some degenerative changes.  Chest x-ray shows no focal consolidation, effusion, pneumothorax, rib fracture or any other acute intrathoracic process.  Film of the right hip shows some degenerative changes but no fracture or dislocation.  She does note she was on the floor for several hours so is possible she is developed some myositis as well.  We will check a CK.  With regard to her dizziness differential includes arrhythmia, ACS, acute infectious process, metabolic derangements possible dehydration.  As noted other than at the right hip which is where patient that she felt no focal deficits to suggest CVA.  Evalose patient for toxic ingestion.  She is not hypotensive and has no history of significant bleeding or GI losses.  ECG and nonelevated troponin patient denying any chest pain or shortness of breath is not consistent with ACS.  No evidence of significant arrhythmia.  COVID and flu PCR is negative.  Patient is not orthostatic.  CBC shows no leukocytosis or acute anemia.  CMP shows no significant electrolyte or metabolic derangements.  Magnesium is within normal limits.  CK is just above upper limit of normal at 280 not consistent with clinically significant rhabdomyolysis.  UA has rare bacteria but otherwise no evidence of infection and  no blood.  On my assessment patient is able to stand and ambulate with some assistance she states typically uses a walker at home.  She states her dizziness is better since she has been in the emergency room.  She denies any other acute sick symptoms is able to bear weight on her right leg and Evalose suspicion for occult hip fracture.  Patient is contusion to right hip with possible muscle strain.  Given she is able to bear weight and otherwise is neurovascularly intact low suspicion for significant occult injury.  Unclear etiology for dizziness although given she states she is no longer dizzy with otherwise nonfocal exam and reassuring work-up I think she is stable for discharge with plan for close outpatient PCP follow-up.  Discharged stable condition.  Strict return precautions advised and discussed.        ____________________________________________   FINAL CLINICAL IMPRESSION(S) / ED DIAGNOSES  Final diagnoses:  Fall, initial encounter  Contusion of right hip, initial encounter    Medications  acetaminophen (TYLENOL) tablet 1,000 mg (1,000 mg Oral Given 04/23/21 1128)     ED Discharge Orders     None        Note:  This document was prepared using Dragon voice recognition software and may include unintentional dictation errors.    Lucrezia Starch, MD 04/23/21 [phone]

## 2021-04-23 NOTE — ED Triage Notes (Signed)
Pt to ED via [facility] from home for fall. Pt in the floor approximately 12 hours. Pt c/o mild pain in her right leg. Pt states that she thinks she pulled a muscle when she was trying to get up. Pt is in NAD.

## 2021-04-23 NOTE — ED Notes (Signed)
Patient ambulating in room with assistance of RN. Patient stating she does feel more unstable than normal.

## 2021-04-25 ENCOUNTER — Telehealth: Payer: Self-pay

## 2021-04-25 NOTE — Telephone Encounter (Signed)
Copied from [place] [id number]. Topic: General - Other >> Apr 24, 2021  4:58 PM Yvette Rack wrote: Reason for CRM: Pt daughter reports that pt went to the hospital on 04/23/21 and she wanted to make sure Dr. Caryn Section was aware

## 2021-04-25 NOTE — Telephone Encounter (Signed)
FYI

## 2021-05-16 ENCOUNTER — Ambulatory Visit (INDEPENDENT_AMBULATORY_CARE_PROVIDER_SITE_OTHER): Payer: PPO | Admitting: Family Medicine

## 2021-05-16 ENCOUNTER — Other Ambulatory Visit: Payer: Self-pay

## 2021-05-16 VITALS — BP 145/68 | HR 71 | Temp 98.4°F | Wt 184.0 lb

## 2021-05-16 DIAGNOSIS — S7000XD Contusion of unspecified hip, subsequent encounter: Secondary | ICD-10-CM | POA: Diagnosis not present

## 2021-05-16 NOTE — Progress Notes (Signed)
Established patient visit   Patient: Christy Mills   DOB: 18-Mar-1934   87 y.o. Female  MRN: [id number] Visit Date: 05/16/2021  Today's healthcare provider: Lelon Huh, MD   Chief Complaint  Patient presents with   Follow-up    ER follow up 04/23/2021   Fall   Hip Pain   Subjective  -------------------------------------------------------------------------------------------------------------------- HPI HPI     Follow-up    Additional comments: ER follow up 04/23/2021      Last edited by Kizzie Furnish, CMA on 05/16/2021  1:14 PM.      Follow up ER visit  Patient was seen in ER for fall and right hip pain on 04/23/2021. She was treated for right hip pain. Had extensive imaging of neck, head, and hips with only age related findings, no acute bony abnormalities.  She reports excellent compliance with treatment. She reports this condition is Improved.  -----------------------------------------------------------------------------------------    Medications: Outpatient Medications Prior to Visit  Medication Sig   acetaminophen (TYLENOL) 500 MG tablet Take 500 mg by mouth.    amLODipine (NORVASC) 5 MG tablet TAKE 1 TABLET BY MOUTH EVERY DAY   aspirin EC 81 MG tablet Take 162 mg by mouth.    calcium carbonate (OSCAL) 1500 (600 Ca) MG TABS tablet Take 600 mg of elemental calcium by mouth 2 (two) times daily with a meal.    furosemide (LASIX) 20 MG tablet TAKE 1 TABLET BY MOUTH EVERY DAY AS NEEDED   lisinopril (ZESTRIL) 40 MG tablet TAKE 1 TABLET BY MOUTH EVERY DAY   LORazepam (ATIVAN) 1 MG tablet TAKE 1 TABLET BY MOUTH TWICE A DAY AS NEEDED   meloxicam (MOBIC) 7.5 MG tablet TAKE 1 TABLET BY MOUTH DAILY AS NEEDED.   Multiple Vitamins-Minerals (CENTRUM SILVER 50+WOMEN) TABS Take 1 tablet by mouth daily.   omeprazole (PRILOSEC) 20 MG capsule TAKE 1 CAPSULE BY MOUTH EVERY DAY   potassium chloride (KLOR-CON) 10 MEQ tablet TAKE 1 TABLET (10 MEQ TOTAL) BY MOUTH DAILY. ON  DAYS SHE TAKES FUROSEMIDE   simvastatin (ZOCOR) 20 MG tablet TAKE 1 TABLET BY MOUTH EVERY DAY   No facility-administered medications prior to visit.    Review of Systems  Constitutional: Negative.   Respiratory: Negative.    Cardiovascular: Negative.   Gastrointestinal: Negative.   Neurological:  Negative for dizziness, light-headedness and headaches.  Psychiatric/Behavioral:  The patient is nervous/anxious (Pt states her husband in the hospital with pneumonia.).       Objective  -------------------------------------------------------------------------------------------------------------------- BP (!) 145/68 (BP Location: Left Arm, Patient Position: Sitting, Cuff Size: Large)   Pulse 71   Temp 98.4 F (36.9 C) (Oral)   Wt 184 lb (83.5 kg)   SpO2 97%   BMI 31.58 kg/m     Physical Exam   General: Appearance:    Obese female in no acute distress  Eyes:    PERRL, conjunctiva/corneas clear, EOM's intact       MS:   All extremities are intact.  Able to stand up and walk slowly with out assistance.   Neurologic:   Awake, alert, oriented x 3. No apparent focal neurological defect.       Assessment & Plan  ---------------------------------------------------------------------------------------------------------------------- 1. Contusion of hip, unspecified laterality, subsequent encounter Mostly healed and back to baseline ambulation. Call if any more problems. Otherwise routine follow up in 3-4 months.       The entirety of the information documented in the History of Present Illness, Review of Systems and  Physical Exam were personally obtained by me. Portions of this information were initially documented by the CMA and reviewed by me for thoroughness and accuracy.     Lelon Huh, MD  [facility] [phone] (phone) [phone] (fax)  [place]

## 2021-05-22 ENCOUNTER — Ambulatory Visit: Payer: Self-pay | Admitting: Family Medicine

## 2021-07-06 ENCOUNTER — Other Ambulatory Visit: Payer: Self-pay | Admitting: Family Medicine

## 2021-07-06 DIAGNOSIS — E785 Hyperlipidemia, unspecified: Secondary | ICD-10-CM

## 2021-07-06 NOTE — Telephone Encounter (Signed)
Requested Prescriptions  Pending Prescriptions Disp Refills  . simvastatin (ZOCOR) 20 MG tablet [Pharmacy Med Name: SIMVASTATIN 20 MG TABLET] 90 tablet 0    Sig: TAKE 1 TABLET BY MOUTH EVERY DAY     Cardiovascular:  Antilipid - Statins Passed - 07/06/2021  1:30 AM      Passed - Total Cholesterol in normal range and within 360 days    Cholesterol, Total  Date Value Ref Range Status  07/21/2020 157 100 - 199 mg/dL Final         Passed - LDL in normal range and within 360 days    LDL Chol Calc (NIH)  Date Value Ref Range Status  07/21/2020 91 0 - 99 mg/dL Final         Passed - HDL in normal range and within 360 days    HDL  Date Value Ref Range Status  07/21/2020 46 >39 mg/dL Final         Passed - Triglycerides in normal range and within 360 days    Triglycerides  Date Value Ref Range Status  07/21/2020 107 0 - 149 mg/dL Final         Passed - Patient is not pregnant      Passed - Valid encounter within last 12 months    Recent Outpatient Visits          1 month ago Contusion of hip, unspecified laterality, subsequent encounter   [facility] Birdie Sons, MD   11 months ago Primary hypertension   [facility] Birdie Sons, MD   1 year ago Essential hypertension   [facility] Birdie Sons, MD   2 years ago Medicare annual wellness visit, subsequent   [facility] Birdie Sons, MD   2 years ago Furuncle of finger, right   [place], Vickki Muff, [place]

## 2021-07-12 ENCOUNTER — Other Ambulatory Visit: Payer: Self-pay | Admitting: Family Medicine

## 2021-07-12 DIAGNOSIS — I10 Essential (primary) hypertension: Secondary | ICD-10-CM

## 2021-07-12 NOTE — Telephone Encounter (Signed)
Requested medications are due for refill today.  yes  Requested medications are on the active medications list.  yes  Last refill. 04/09/2021  Future visit scheduled.   yes  Notes to clinic.  Patient last seen for HTN 07/20/2020.  More than 3 months overdue for OV.

## 2021-07-23 ENCOUNTER — Other Ambulatory Visit: Payer: Self-pay | Admitting: Family Medicine

## 2021-07-23 DIAGNOSIS — M2550 Pain in unspecified joint: Secondary | ICD-10-CM

## 2021-07-28 DIAGNOSIS — M2012 Hallux valgus (acquired), left foot: Secondary | ICD-10-CM | POA: Diagnosis not present

## 2021-07-28 DIAGNOSIS — M79674 Pain in right toe(s): Secondary | ICD-10-CM | POA: Diagnosis not present

## 2021-07-28 DIAGNOSIS — M79675 Pain in left toe(s): Secondary | ICD-10-CM | POA: Diagnosis not present

## 2021-07-28 DIAGNOSIS — L600 Ingrowing nail: Secondary | ICD-10-CM | POA: Diagnosis not present

## 2021-07-28 DIAGNOSIS — B351 Tinea unguium: Secondary | ICD-10-CM | POA: Diagnosis not present

## 2021-08-21 IMAGING — MG DIGITAL SCREENING BILAT W/ TOMO
8 series · 8 of 24 positions shown · non-contrast
Comparison: Previous exam(s).

CLINICAL DATA: Screening.

EXAM:
DIGITAL SCREENING BILATERAL MAMMOGRAM WITH TOMO AND CAD

[L CC synth-2D]
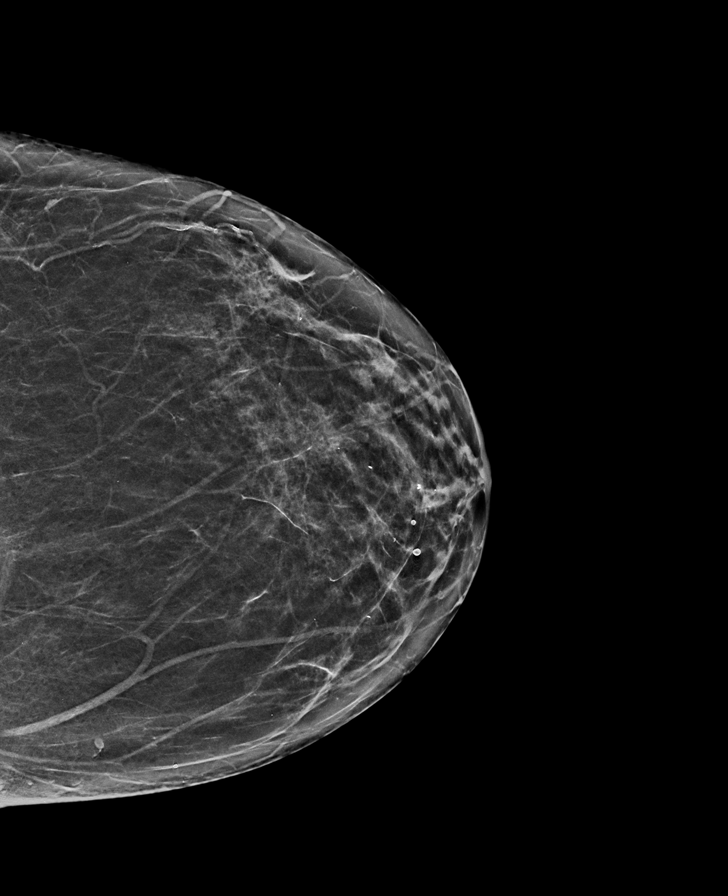

[L MLO synth-2D]
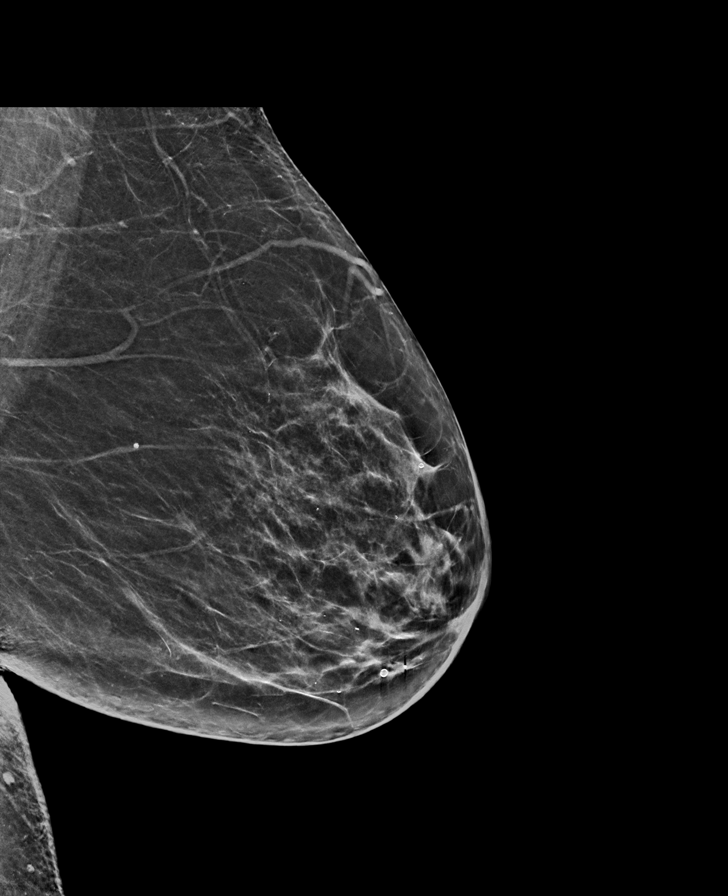

[R CC synth-2D]
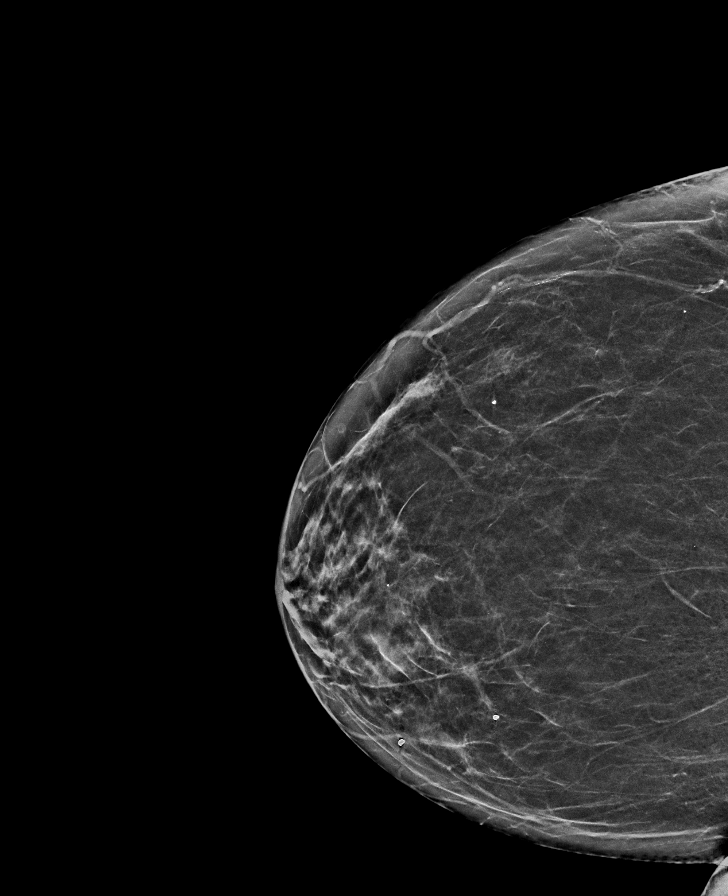

[R MLO synth-2D]
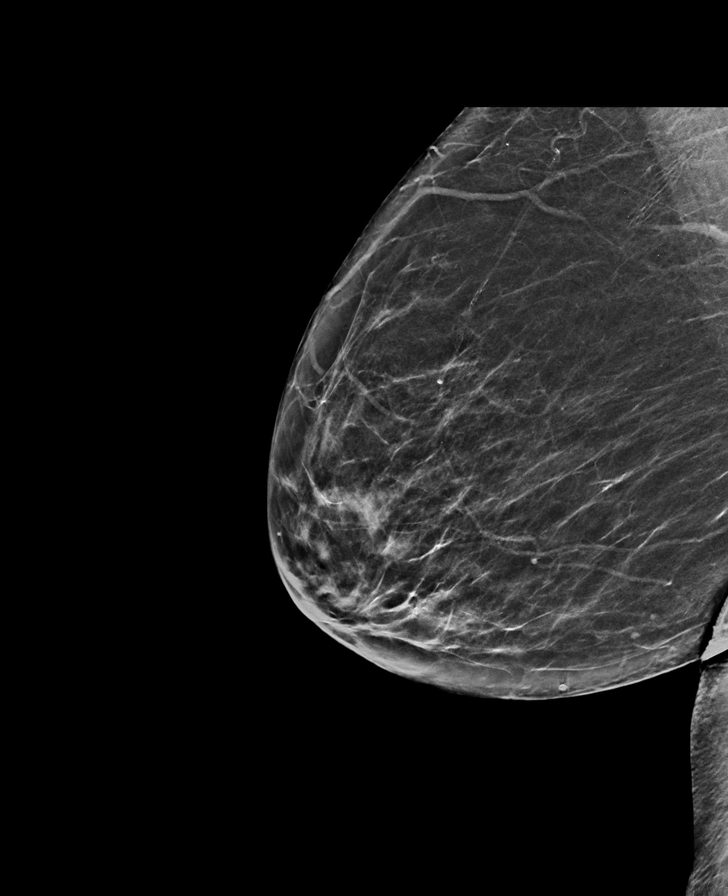

[L CC tomo · tomo slice 31/62.0]
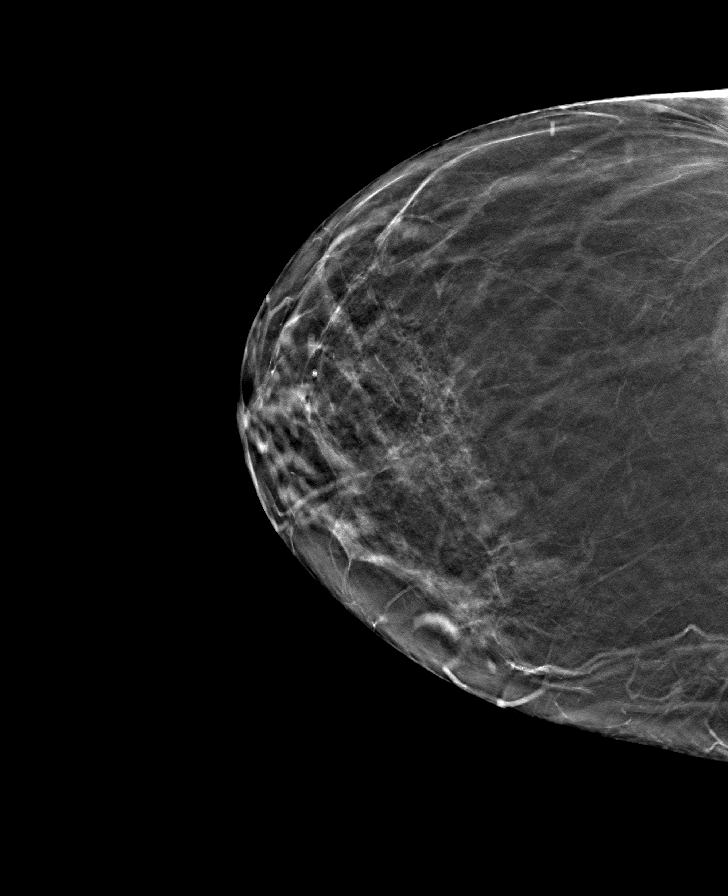

[L MLO tomo · tomo slice 35/68.0]
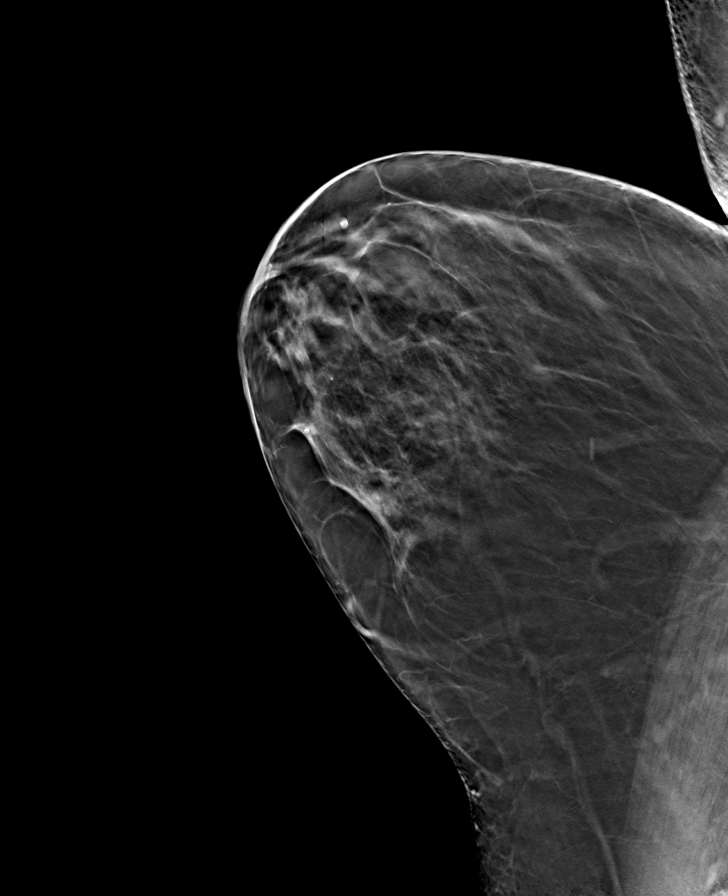

[R MLO tomo · tomo slice 33/66.0]
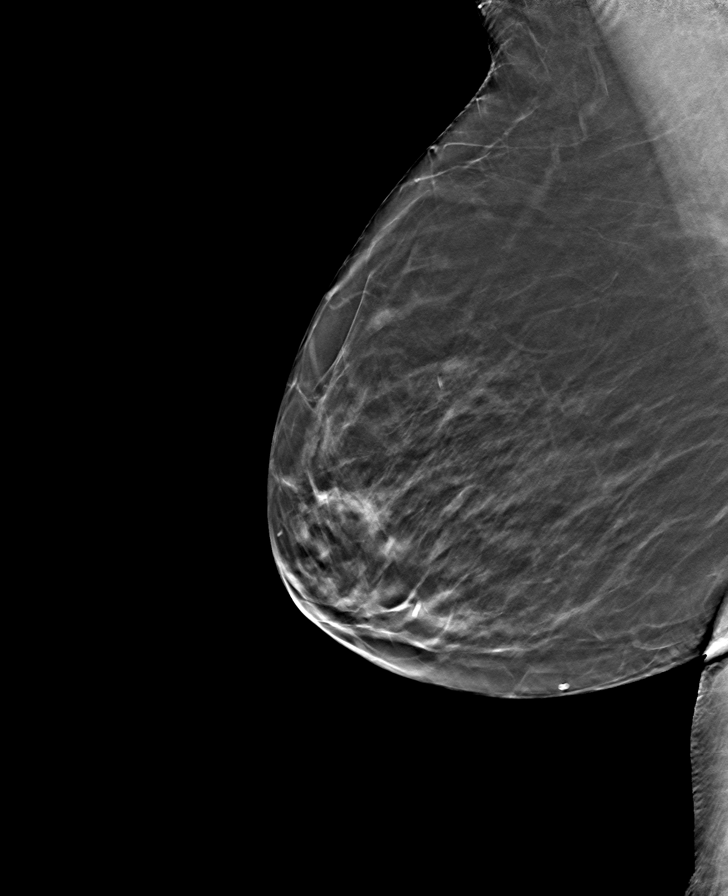

[R CC tomo · tomo slice 29/57.0]
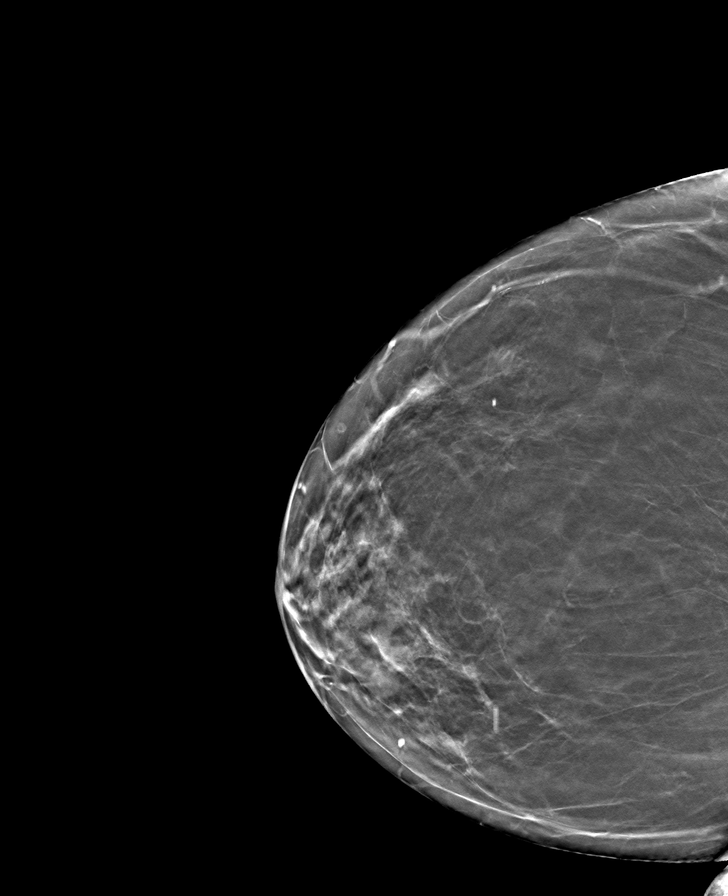

[8 of 24 positions shown; findings below may reference images not displayed]

ACR Breast Density Category b: There are scattered areas of
fibroglandular density.
FINDINGS: There are no findings suspicious for malignancy. Images were
processed with CAD.
IMPRESSION: No mammographic evidence of malignancy. A result letter of this
screening mammogram will be mailed directly to the patient.

RECOMMENDATION:
Screening mammogram in one year. (Code:[id number])

BI-RADS CATEGORY  1: Negative.

## 2021-09-01 ENCOUNTER — Other Ambulatory Visit: Payer: Self-pay

## 2021-09-01 ENCOUNTER — Ambulatory Visit (INDEPENDENT_AMBULATORY_CARE_PROVIDER_SITE_OTHER): Payer: PPO | Admitting: Family Medicine

## 2021-09-01 ENCOUNTER — Encounter: Payer: Self-pay | Admitting: Family Medicine

## 2021-09-01 VITALS — BP 132/71 | HR 60 | Temp 97.6°F | Resp 18 | Ht 64.0 in | Wt 183.0 lb

## 2021-09-01 DIAGNOSIS — R609 Edema, unspecified: Secondary | ICD-10-CM

## 2021-09-01 DIAGNOSIS — E7800 Pure hypercholesterolemia, unspecified: Secondary | ICD-10-CM | POA: Diagnosis not present

## 2021-09-01 DIAGNOSIS — Z0000 Encounter for general adult medical examination without abnormal findings: Secondary | ICD-10-CM | POA: Diagnosis not present

## 2021-09-01 DIAGNOSIS — M810 Age-related osteoporosis without current pathological fracture: Secondary | ICD-10-CM | POA: Diagnosis not present

## 2021-09-01 DIAGNOSIS — K219 Gastro-esophageal reflux disease without esophagitis: Secondary | ICD-10-CM

## 2021-09-01 DIAGNOSIS — I10 Essential (primary) hypertension: Secondary | ICD-10-CM

## 2021-09-01 NOTE — Progress Notes (Signed)
Annual Wellness Visit     Patient: Christy Mills, Female    DOB: 26-Sep-1934, 87 y.o.   MRN: [id number] Visit Date: 09/01/2021  Today's Provider: Lelon Huh, MD    Subjective    Christy Mills is a 87 y.o. female who presents today for her Annual Wellness Visit.   Medications: Outpatient Medications Prior to Visit  Medication Sig   acetaminophen (TYLENOL) 500 MG tablet Take 500 mg by mouth.    amLODipine (NORVASC) 5 MG tablet TAKE 1 TABLET BY MOUTH EVERY DAY   aspirin EC 81 MG tablet Take 162 mg by mouth.    calcium carbonate (OSCAL) 1500 (600 Ca) MG TABS tablet Take 600 mg of elemental calcium by mouth 2 (two) times daily with a meal.    furosemide (LASIX) 20 MG tablet TAKE 1 TABLET BY MOUTH EVERY DAY AS NEEDED   lisinopril (ZESTRIL) 40 MG tablet TAKE 1 TABLET BY MOUTH EVERY DAY   LORazepam (ATIVAN) 1 MG tablet TAKE 1 TABLET BY MOUTH TWICE A DAY AS NEEDED   meloxicam (MOBIC) 7.5 MG tablet TAKE 1 TABLET BY MOUTH EVERY DAY AS NEEDED   Multiple Vitamins-Minerals (CENTRUM SILVER 50+WOMEN) TABS Take 1 tablet by mouth daily.   omeprazole (PRILOSEC) 20 MG capsule TAKE 1 CAPSULE BY MOUTH EVERY DAY   potassium chloride (KLOR-CON) 10 MEQ tablet TAKE 1 TABLET (10 MEQ TOTAL) BY MOUTH DAILY. ON DAYS SHE TAKES FUROSEMIDE   simvastatin (ZOCOR) 20 MG tablet TAKE 1 TABLET BY MOUTH EVERY DAY   No facility-administered medications prior to visit.    Allergies  Allergen Reactions   Ciprofloxacin Hcl Other (See Comments)    tendinopathy    Patient Care Team: Birdie Sons, MD as PCP - General (Family Medicine) Thelma Comp, [place] as Consulting Physician (Optometry) Hooten, Laurice Record, MD as Consulting Physician (Orthopedic Surgery) Ralene Bathe, MD (Dermatology) Merril Abbe, MD as Referring Physician (Dermatology)        Objective     Most recent functional status assessment: In your present state of health, do you have any difficulty performing the following  activities: 09/01/2021  Hearing? Y  Vision? N  Difficulty concentrating or making decisions? N  Walking or climbing stairs? Y  Dressing or bathing? N  Doing errands, shopping? N  Some recent data might be hidden   Most recent fall risk assessment: Fall Risk  09/01/2021  Falls in the past year? 1  Comment -  Number falls in past yr: 0  Comment -  Injury with Fall? 0  Risk for fall due to : History of fall(s)  Follow up Falls evaluation completed    Most recent depression screenings: PHQ 2/9 Scores 09/01/2021 05/16/2021  PHQ - 2 Score 0 1  PHQ- 9 Score 5 3   Most recent cognitive screening: No flowsheet data found. Most recent Audit-C alcohol use screening Alcohol Use Disorder Test (AUDIT) 09/01/2021  1. How often do you have a drink containing alcohol? 0  2. How many drinks containing alcohol do you have on a typical day when you are drinking? 0  3. How often do you have six or more drinks on one occasion? 0  AUDIT-C Score 0  Alcohol Brief Interventions/Follow-up -   A score of 3 or more in women, and 4 or more in men indicates increased risk for alcohol abuse, EXCEPT if all of the points are from question 1   No results found for any visits on  09/01/21.  Assessment & Plan     Annual wellness visit done today including the all of the following: Reviewed patient's Family Medical History Reviewed and updated list of patient's medical providers Assessment of cognitive impairment was done Assessed patient's functional ability Established a written schedule for health screening [place] Completed and Reviewed  Exercise Activities and Dietary recommendations  Goals      Reduce sugar intake      Recommend decreasing sugar in daily diet. Pt cut out eating desserts.         Immunization History  Administered Date(s) Administered   Fluad Quad(high Dose 65+) 07/07/2019, 07/20/2020   Influenza, High Dose Seasonal PF 09/02/2018, 06/01/2021    Influenza-Unspecified 08/27/2015   PNEUMOCOCCAL CONJUGATE-20 05/19/2021   Pneumococcal Conjugate-13 05/13/2014   Pneumococcal Polysaccharide-23 08/17/2004   Pneumococcal-Unspecified 08/17/2004   Td 07/27/2005   Zoster, Live 07/04/2012    Health Maintenance  Topic Date Due   COVID-19 Vaccine (1) Never done   Zoster Vaccines- Shingrix (1 of 2) Never done   TETANUS/TDAP  07/28/2015   DEXA SCAN  07/29/2021   Pneumonia Vaccine 26+ Years old  Completed   INFLUENZA VACCINE  Completed   HPV VACCINES  Aged Out     Discussed health benefits of physical activity, and encouraged her to engage in regular exercise appropriate for her age and condition.         The entirety of the information documented in the History of Present Illness, Review of Systems and Physical Exam were personally obtained by me. Portions of this information were initially documented by the CMA and reviewed by me for thoroughness and accuracy.     Lelon Huh, MD  [facility] [phone] (phone) [phone] (fax)  [place]

## 2021-09-01 NOTE — Progress Notes (Signed)
Complete Physical Exam      Patient: Christy Mills, Female    DOB: 05/02/1934, 87 y.o.   MRN: [id number] Visit Date: 09/01/2021  Today's Provider: Lelon Huh, MD   Chief Complaint  Patient presents with   Medicare Wellness   Hypertension   Subjective    Christy Mills is a 87 y.o. female who presents today for her complete physical examination  She reports consuming a general diet. The patient does not participate in regular exercise at present. She generally feels fairly well. She reports sleeping fairly well. She does not have additional problems to discuss today.   HPI Hypertension, follow-up  BP Readings from Last 3 Encounters:  09/01/21 132/71  05/16/21 (!) 145/68  04/23/21 (!) 166/67   Wt Readings from Last 3 Encounters:  09/01/21 183 lb (83 kg)  05/16/21 184 lb (83.5 kg)  04/23/21 188 lb (85.3 kg)     She was last seen for hypertension 1  year  ago.  BP at that visit was 141/59. Management since that visit includes continue same medication.  She reports good compliance with treatment. She is not having side effects.  She is following a Regular diet. She is not exercising. She does not smoke.  Use of agents associated with hypertension: NSAIDS.   Outside blood pressures are not checked. Symptoms: No chest pain No chest pressure  No palpitations No syncope  No dyspnea No orthopnea  No paroxysmal nocturnal dyspnea No lower extremity edema   Pertinent labs: Lab Results  Component Value Date   CHOL 157 07/21/2020   HDL 46 07/21/2020   LDLCALC 91 07/21/2020   TRIG 107 07/21/2020   CHOLHDL 3.4 07/21/2020   Lab Results  Component Value Date   NA 139 04/23/2021   K 3.9 04/23/2021   CREATININE 0.60 04/23/2021   GFRNONAA >60 04/23/2021   GLUCOSE 106 (H) 04/23/2021   TSH 1.290 07/10/2019     The ASCVD Risk score (Arnett DK, et al., 2019) failed to calculate for the following reasons:   The 2019 ASCVD risk score is only valid for ages 87 to 61    ---------------------------------------------------------------------------------------------------   Anxiety, Follow-up  She was last seen for anxiety 1  year  ago. Changes made at last visit include changing Lorazepam prescription to permit occasional second tablet.   She reports good compliance with treatment. She reports good tolerance of treatment. She is not having side effects.   She feels her anxiety is mild and Unchanged since last visit.  Symptoms: No chest pain No difficulty concentrating  No dizziness No fatigue  No feelings of losing control No insomnia  No irritable No palpitations  No panic attacks No racing thoughts  No shortness of breath No sweating  No tremors/shakes    GAD-7 Results No flowsheet data found.  PHQ-9 Scores PHQ9 SCORE ONLY 09/01/2021 05/16/2021 07/20/2020  PHQ-9 Total Score 5 3 5     ---------------------------------------------------------------------------------------------------   Lipid/Cholesterol, Follow-up  Last lipid panel  Lab Results  Component Value Date   CHOL 157 07/21/2020   HDL 46 07/21/2020   LDLCALC 91 07/21/2020   TRIG 107 07/21/2020   CHOLHDL 3.4 07/21/2020     She was last seen for this 1  year  ago.  Management since that visit includes continue same medication.  She reports good compliance with treatment. She is not having side effects.   Symptoms: No chest pain No chest pressure/discomfort  No dyspnea No lower extremity edema  No numbness or tingling of extremity No orthopnea  No palpitations No paroxysmal nocturnal dyspnea  No speech difficulty No syncope   Current diet: in general, an "unhealthy" diet Current exercise: none  The ASCVD Risk score (Arnett DK, et al., 2019) failed to calculate for the following reasons:   The 2019 ASCVD risk score is only valid for ages 56 to 76  ---------------------------------------------------------------------------------------------------     Medications: Outpatient Medications Prior to Visit  Medication Sig   acetaminophen (TYLENOL) 500 MG tablet Take 500 mg by mouth.    amLODipine (NORVASC) 5 MG tablet TAKE 1 TABLET BY MOUTH EVERY DAY   aspirin EC 81 MG tablet Take 162 mg by mouth.    calcium carbonate (OSCAL) 1500 (600 Ca) MG TABS tablet Take 600 mg of elemental calcium by mouth 2 (two) times daily with a meal.    furosemide (LASIX) 20 MG tablet TAKE 1 TABLET BY MOUTH EVERY DAY AS NEEDED   lisinopril (ZESTRIL) 40 MG tablet TAKE 1 TABLET BY MOUTH EVERY DAY   LORazepam (ATIVAN) 1 MG tablet TAKE 1 TABLET BY MOUTH TWICE A DAY AS NEEDED   meloxicam (MOBIC) 7.5 MG tablet TAKE 1 TABLET BY MOUTH EVERY DAY AS NEEDED   Multiple Vitamins-Minerals (CENTRUM SILVER 50+WOMEN) TABS Take 1 tablet by mouth daily.   omeprazole (PRILOSEC) 20 MG capsule TAKE 1 CAPSULE BY MOUTH EVERY DAY   potassium chloride (KLOR-CON) 10 MEQ tablet TAKE 1 TABLET (10 MEQ TOTAL) BY MOUTH DAILY. ON DAYS SHE TAKES FUROSEMIDE   simvastatin (ZOCOR) 20 MG tablet TAKE 1 TABLET BY MOUTH EVERY DAY   No facility-administered medications prior to visit.    Allergies  Allergen Reactions   Ciprofloxacin Hcl Other (See Comments)    tendinopathy    Patient Care Team: Birdie Sons, MD as PCP - General (Family Medicine) Thelma Comp, [place] as Consulting Physician (Optometry) Hooten, Laurice Record, MD as Consulting Physician (Orthopedic Surgery) Ralene Bathe, MD (Dermatology) Merril Abbe, MD as Referring Physician (Dermatology)  Review of Systems  Constitutional:  Negative for chills, fatigue and fever.  HENT:  Positive for hearing loss. Negative for congestion, ear pain, rhinorrhea, sneezing and sore throat.   Eyes: Negative.  Negative for pain and redness.  Respiratory:  Positive for cough. Negative for shortness of breath and wheezing.   Cardiovascular:  Negative for chest pain and leg swelling.  Gastrointestinal:  Negative for abdominal pain, blood  in stool, constipation, diarrhea and nausea.  Endocrine: Positive for polyuria. Negative for polydipsia and polyphagia.  Genitourinary: Negative.  Negative for dysuria, flank pain, hematuria, pelvic pain, vaginal bleeding and vaginal discharge.  Musculoskeletal:  Positive for arthralgias. Negative for back pain, gait problem and joint swelling.  Skin:  Negative for rash.  Neurological: Negative.  Negative for dizziness, tremors, seizures, weakness, light-headedness, numbness and headaches.  Hematological:  Negative for adenopathy.  Psychiatric/Behavioral:  Positive for decreased concentration. Negative for behavioral problems, confusion and dysphoric mood. The patient is not nervous/anxious and is not hyperactive.        Objective    Vitals: BP 132/71 (BP Location: Left Arm, Patient Position: Sitting, Cuff Size: Normal)   Pulse 60   Temp 97.6 F (36.4 C) (Oral)   Resp 18   Ht 5\' 4"  (1.626 m)   Wt 183 lb (83 kg)   SpO2 100% Comment: room air  BMI 31.41 kg/m    Physical Exam   General Appearance:    Mildly obese female. Alert, cooperative, in no  acute distress, appears stated age   Head:    Normocephalic, without obvious abnormality, atraumatic  Eyes:    PERRL, conjunctiva/corneas clear, EOM's intact, fundi    benign, both eyes  Ears:    Normal TM's and external ear canals, both ears  Neck:   Supple, symmetrical, trachea midline, no adenopathy;    thyroid:  no enlargement/tenderness/nodules; no carotid   bruit or JVD  Back:     Symmetric, no curvature, ROM normal, no CVA tenderness  Lungs:     Clear to auscultation bilaterally, respirations unlabored  Chest Wall:    No tenderness or deformity   Heart:    Normal heart rate. Normal rhythm.  2/6 systolic murmur  Breast Exam:    deferred  Abdomen:     Soft, non-tender, bowel sounds active all four quadrants,    no masses, no organomegaly  Pelvic:    deferred  Extremities:   All extremities are intact. 2+ bilateral feet and  lower leg edema. No erythema.   Pulses:   2+ and symmetric all extremities  Skin:   Skin color, texture, turgor normal, no rashes or lesions  Lymph nodes:   Cervical, supraclavicular, and axillary nodes normal  Neurologic:   CNII-XII intact, normal strength, sensation and reflexes    throughout      Assessment & Plan     1. Annual physical exam   2. Primary hypertension Well controlled.  Continue current medications.   - CBC - Comprehensive metabolic panel  3. Pure hypercholesterolemia She is tolerating simvastatin well with no adverse effects.   - Lipid panel  4. Osteoporosis, unspecified osteoporosis type, unspecified pathological fracture presence  - VITAMIN D 25 Hydroxy (Vit-D Deficiency, Fractures)  5. Edema, unspecified type  - TSH  6. Gastroesophageal reflux disease, unspecified whether esophagitis present Very well controlled with omeprazole.     The entirety of the information documented in the History of Present Illness, Review of Systems and Physical Exam were personally obtained by me. Portions of this information were initially documented by the CMA and reviewed by me for thoroughness and accuracy.     Lelon Huh, MD  [facility] [phone] (phone) [phone] (fax)  [place]

## 2021-09-02 LAB — COMPREHENSIVE METABOLIC PANEL
ALT: 14 IU/L (ref 0–32)
AST: 20 IU/L (ref 0–40)
Albumin/Globulin Ratio: 1.7 (ref 1.2–2.2)
Albumin: 4.4 g/dL (ref 3.6–4.6)
Alkaline Phosphatase: 104 IU/L (ref 44–121)
BUN/Creatinine Ratio: 19 (ref 12–28)
BUN: 13 mg/dL (ref 8–27)
Bilirubin Total: 0.3 mg/dL (ref 0.0–1.2)
CO2: 23 mmol/L (ref 20–29)
Calcium: 9.7 mg/dL (ref 8.7–10.3)
Chloride: 102 mmol/L (ref 96–106)
Creatinine, Ser: 0.69 mg/dL (ref 0.57–1.00)
Globulin, Total: 2.6 g/dL (ref 1.5–4.5)
Glucose: 87 mg/dL (ref 70–99)
Potassium: 4 mmol/L (ref 3.5–5.2)
Sodium: 144 mmol/L (ref 134–144)
Total Protein: 7 g/dL (ref 6.0–8.5)
eGFR: 85 mL/min/{1.73_m2} (ref >59)

## 2021-09-02 LAB — LIPID PANEL
Chol/HDL Ratio: 4.1 ratio (ref 0.0–4.4)
Cholesterol, Total: 189 mg/dL (ref 100–199)
HDL: 46 mg/dL (ref >39)
LDL Chol Calc (NIH): 117 mg/dL — ABNORMAL HIGH (ref 0–99)
Triglycerides: 144 mg/dL (ref 0–149)
VLDL Cholesterol Cal: 26 mg/dL (ref 5–40)

## 2021-09-02 LAB — CBC
Hematocrit: 45.8 % (ref 34.0–46.6)
Hemoglobin: 15.8 g/dL (ref 11.1–15.9)
MCH: 31.5 pg (ref 26.6–33.0)
MCHC: 34.5 g/dL (ref 31.5–35.7)
MCV: 91 fL (ref 79–97)
Platelets: 209 10*3/uL (ref 150–450)
RBC: 5.01 x10E6/uL (ref 3.77–5.28)
RDW: 13.1 % (ref 11.7–15.4)
WBC: 6.6 10*3/uL (ref 3.4–10.8)

## 2021-09-02 LAB — VITAMIN D 25 HYDROXY (VIT D DEFICIENCY, FRACTURES): Vit D, 25-Hydroxy: 47.8 ng/mL (ref 30.0–100.0)

## 2021-09-02 LAB — TSH: TSH: 1.1 u[IU]/mL (ref 0.450–4.500)

## 2021-09-27 ENCOUNTER — Other Ambulatory Visit: Payer: Self-pay | Admitting: Family Medicine

## 2021-09-27 DIAGNOSIS — F419 Anxiety disorder, unspecified: Secondary | ICD-10-CM

## 2021-10-02 ENCOUNTER — Other Ambulatory Visit: Payer: Self-pay | Admitting: Family Medicine

## 2021-10-02 DIAGNOSIS — E785 Hyperlipidemia, unspecified: Secondary | ICD-10-CM

## 2021-10-18 ENCOUNTER — Other Ambulatory Visit: Payer: Self-pay | Admitting: Family Medicine

## 2021-10-18 DIAGNOSIS — I10 Essential (primary) hypertension: Secondary | ICD-10-CM

## 2021-10-18 NOTE — Telephone Encounter (Signed)
Requested Prescriptions  Pending Prescriptions Disp Refills   lisinopril (ZESTRIL) 40 MG tablet [Pharmacy Med Name: LISINOPRIL 40 MG TABLET] 90 tablet 1    Sig: TAKE 1 TABLET BY MOUTH EVERY DAY     Cardiovascular:  ACE Inhibitors Passed - 10/18/2021  1:21 AM      Passed - Cr in normal range and within 180 days    Creatinine  Date Value Ref Range Status  10/23/2013 0.81 0.60 - 1.30 mg/dL Final   Creatinine, Ser  Date Value Ref Range Status  09/01/2021 0.69 0.57 - 1.00 mg/dL Final         Passed - K in normal range and within 180 days    Potassium  Date Value Ref Range Status  09/01/2021 4.0 3.5 - 5.2 mmol/L Final  10/23/2013 4.1 3.5 - 5.1 mmol/L Final         Passed - Patient is not pregnant      Passed - Last BP in normal range    BP Readings from Last 1 Encounters:  09/01/21 132/71         Passed - Valid encounter within last 6 months    Recent Outpatient Visits          1 month ago Annual physical exam   [facility] Birdie Sons, MD   5 months ago Contusion of hip, unspecified laterality, subsequent encounter   [facility] Birdie Sons, MD   1 year ago Primary hypertension   [facility] Birdie Sons, MD   1 year ago Essential hypertension   [facility] Birdie Sons, MD   2 years ago Medicare annual wellness visit, subsequent   [facility] Birdie Sons, MD      Future Appointments            In 4 months Fisher, Kirstie Peri, MD [facility], [place]

## 2021-12-28 DIAGNOSIS — M79645 Pain in left finger(s): Secondary | ICD-10-CM | POA: Diagnosis not present

## 2021-12-28 DIAGNOSIS — M65342 Trigger finger, left ring finger: Secondary | ICD-10-CM | POA: Diagnosis not present

## 2022-01-01 DIAGNOSIS — M65342 Trigger finger, left ring finger: Secondary | ICD-10-CM | POA: Diagnosis not present

## 2022-01-22 ENCOUNTER — Ambulatory Visit: Payer: PPO | Attending: Orthopedic Surgery | Admitting: Occupational Therapy

## 2022-01-22 ENCOUNTER — Encounter: Payer: Self-pay | Admitting: Occupational Therapy

## 2022-01-22 DIAGNOSIS — M79642 Pain in left hand: Secondary | ICD-10-CM | POA: Diagnosis not present

## 2022-01-22 DIAGNOSIS — L905 Scar conditions and fibrosis of skin: Secondary | ICD-10-CM | POA: Diagnosis not present

## 2022-01-22 DIAGNOSIS — M25642 Stiffness of left hand, not elsewhere classified: Secondary | ICD-10-CM | POA: Diagnosis not present

## 2022-01-22 DIAGNOSIS — M6281 Muscle weakness (generalized): Secondary | ICD-10-CM | POA: Insufficient documentation

## 2022-01-22 NOTE — Therapy (Signed)
[place] [facility] [facility] ?[address] ?[place], [place], [address] ?Phone: [phone]   Fax:  [phone] ? ?Occupational Therapy Evaluation ? ?Patient Details  ?Name: Christy Mills ?MRN: [id number] ?Date of Birth: 09/02/1934 ?Referring Provider (OT): DR Rudene Christians ? ? ?Encounter Date: 01/22/2022 ? ? OT End of Session - 01/22/22 1347   ? ? Visit Number 1   ? Number of Visits 8   ? Date for OT Re-Evaluation 03/05/22   ? OT Start Time 1347   ? OT Stop Time 1430   ? OT Time Calculation (min) 43 min   ? Activity Tolerance Patient tolerated treatment well   ? Behavior During Therapy [facility] for tasks assessed/performed   ? ?  ?  ? ?  ? ? ?Past Medical History:  ?Diagnosis Date  ? Anxiety 1980  ? difficulty sleeping after mother died.  ? Arthritis   ? Basal cell carcinoma (BCC) of skin of right upper lip   ? S/P MOHS Dr. Ishmael Holter [facility] 10/24/2018  ? Cancer [facility])   ? skin resected from nose in 2014.  ? History of trigger finger   ? Hyperlipidemia   ? Hypertension   ? Osteoporosis   ? ? ?Past Surgical History:  ?Procedure Laterality Date  ? APPENDECTOMY  1978  ? CATARACT EXTRACTION W/ INTRAOCULAR LENS IMPLANT Bilateral 2004  ? one eye than the other a month laster  ? HYSTEROSCOPY WITH D & C N/A 08/10/2016  ? Procedure: DILATATION AND CURETTAGE /HYSTEROSCOPY;  Surgeon: Honor Loh Ward, MD;  Location: [facility] ORS;  Service: Gynecology;  Laterality: N/A;  ? JOINT REPLACEMENT Right 2015  ? MOHS SURGERY  2014  ? nose  ? MOHS SURGERY Right 10/24/2018  ? upper lip [facility]mael Hol[facility] 10/24/2018  ? OOPHORECTOMY Right   ? PAROTID GLAND TUMOR EXCISION Left   ? [place]; benign warthins tumor  ? TOTAL KNEE ARTHROPLASTY Right 10/2013  ? ? ?There were no vitals filed for this visit. ? ? Subjective Assessment - 01/22/22 1346   ? ? Subjective  I had shots in the past my finger but did not work anymore.  And he was triggering but then it locked down on me for 3 weeks and I could not use my hand.  I was  hoping I did not need therapy   ? Pertinent History Christy Mills is a 88 y.o. female who presents today s/p Left ring trigger finger release 01/01/22 by Dr. Rudene Christians. On 01/15/22 suture removed , pt's  pain is improving. No swelling warmth erythema or drainage. She continues have a flexion contracture of the left ring PIP joint. She is been working on some gentle stretching exercises. Refer to OT   ? Patient Stated Goals I want to be able to use my hand and gripping and opening it.   ? Currently in Pain? Yes   Patient unable to do pain scale reports tenderness in PIP joint and pain at night  ? ?  ?  ? ?  ? ? ? ? [facility] OT Assessment - 01/22/22 0001   ? ?  ? Assessment  ? Medical Diagnosis L 4th trigger finger release   ? Referring Provider (OT) DR Rudene Christians   ? Onset Date/Surgical Date 01/01/22   ? Hand Dominance Right   ?  ? Home  Environment  ? Lives With Alone   ?  ? Prior Function  ? Leisure husband in care home, visits him every day.  House work , drive,   ?  ? Left Hand AROM  ? L Ring  MCP 0-90 90 Degrees   ? L Ring PIP 0-100 100 Degrees   -20  ? L Ring DIP 0-70 20 Degrees   pain  ? ?  ?  ? ?  ? ? ? ? ?  Patient educated on home program use moist heat-patient to do morning and later afternoon second session prior to motion and massage ?Rolling into digit extension over foam roller 20 reps for soft tissue massage increasing PIP extension ?Digit extension into the table with slight pressure behind fourth PIP 10 reps holding 5 to 10 seconds. ?Then MC flexion with PIP extension-patient has a hard time with this exercise as well as intrinsic a fist appeared to cause discomfort and clicking because of limited DIP and intrinsic of flexion.(Hold off on this HEP) ?Then composite fist 10 reps ?Reinforced with patient to do not forceful flexion or squeezing but only light motion with gentle passive range of motion ?Patient able to do composite fist touching palm with no triggering in session after addressing PIP extension, ?Patient  limited with DIP flexion and pain with passive range of motion-holding off will at next time ?Reviewed with patient scar massage 2 times a day after moist heat ? ? ? ? ? ? ? ? ? ? ? ? ? ? OT Education - 01/22/22 1347   ? ? Education Details Findings of eval and home program education   ? Person(s) Educated Patient   ? Methods Explanation;Handout;Tactile cues;Demonstration   ? Comprehension Verbalized understanding;Returned demonstration;Verbal cues required;Tactile cues required   ? ?  ?  ? ?  ? ? ? OT Short Term Goals - 01/22/22 1437   ? ?  ? OT SHORT TERM GOAL #1  ? Title Patient to be independent in home program to decrease flexor contracture and maintain flexion touching palm at left fourth digit PIP   ? Baseline Stiffness in the fourth digit in the morning especially during the day improving but decrease DIP for flexion, PIP flexion contracture of -20   ? Time 3   ? Period Weeks   ? Status New   ? Target Date 02/12/22   ? ?  ?  ? ?  ? ? ? ? OT Long Term Goals - 01/22/22 1438   ? ?  ? OT LONG TERM GOAL #1  ? Title Left fourth digit PIP extension improve to less than -10 degrees for patient to donn and doff gloves and hand in pocket while maintianing flexion of 4th   ? Baseline Left fourth PIP flexion contracture -20, increase stiffness with flexion after working or addressing extension of fourth digit PIP   ? Time 6   ? Period Weeks   ? Status New   ? Target Date 03/05/22   ?  ? OT LONG TERM GOAL #2  ? Title L 4th digit flexion improve with pain minimal to squeez washcloth and grip increase to more than 50% compare to L to carry groceries or bag   ? Baseline tenderness at PIP 4th - severe pain at night 4th PIP - - decrease DIP flexion- stiffness in am and after working on extnetion- NT grip 3 wks s/p   ? Time 6   ? Period Weeks   ? Status New   ? Target Date 03/05/22   ? ?  ?  ? ?  ? ? ? ? ? ? ? ? Plan -  01/22/22 1348   ? ? Clinical Impression Statement Patient referred to OT with postop left fourth trigger  finger release on 01/01/2022.  Patient report her finger was locked down for 3 weeks and flexion prior to surgery.  Patient presents today with increase pain at night and fourth digit and tenderness at PIP joint.  Patient report unable to make a fist in the morning.  This date and session patient able to do L 4th MC flexion 90, PIP 100 but decrease DIP flexion -and flexor contracture of -20 degrees.  Patient HOH and needs step-by-step review of home programs as well as repetition, tactile cues and demonstration.  Patient has tenderness at PIP joint and reports pain at night times severe in fourth digit but after using heat or Voltaren ointment feels better.  Patient limited in use of left hand and ADLs and IADLs and can benefit from OT services.   ? OT Occupational Profile and History Problem Focused Assessment - Including review of records relating to presenting problem   ? Occupational performance deficits (Please refer to evaluation for details): ADL's;IADL's;Play;Leisure   ? Rehab Potential Fair   ? Clinical Decision Making Limited treatment options, no task modification necessary   ? Comorbidities Affecting Occupational Performance: None   ? Modification or Assistance to Complete Evaluation  No modification of tasks or assist necessary to complete eval   ? OT Frequency --   1-2 x wk  ? OT Duration 6 weeks   ? OT Treatment/Interventions Self-care/ADL training;Moist Heat;Fluidtherapy;Splinting;Therapeutic exercise;Paraffin;Manual Therapy;Patient/family education;Passive range of motion;Scar mobilization   ? Consulted and Agree with Plan of Care Patient   ? ?  ?  ? ?  ? ? ?Patient will benefit from skilled therapeutic intervention in order to improve the following deficits and impairments:   ?  ?  ?  ? ? ?Visit Diagnosis: ?Stiffness of left hand, not elsewhere classified - Plan: Ot plan of care cert/re-cert ? ?Scar condition and fibrosis of skin - Plan: Ot plan of care cert/re-cert ? ?Pain in left hand - Plan: Ot  plan of care cert/re-cert ? ?Muscle weakness (generalized) - Plan: Ot plan of care cert/re-cert ? ? ? ?Problem List ?Patient Active Problem List  ? Diagnosis Date Noted  ? Bilateral edema of lower extremity 0

## 2022-01-29 ENCOUNTER — Ambulatory Visit: Payer: PPO | Attending: Orthopedic Surgery | Admitting: Occupational Therapy

## 2022-01-29 DIAGNOSIS — M79642 Pain in left hand: Secondary | ICD-10-CM | POA: Insufficient documentation

## 2022-01-29 DIAGNOSIS — L905 Scar conditions and fibrosis of skin: Secondary | ICD-10-CM | POA: Insufficient documentation

## 2022-01-29 DIAGNOSIS — M25642 Stiffness of left hand, not elsewhere classified: Secondary | ICD-10-CM | POA: Diagnosis not present

## 2022-01-29 DIAGNOSIS — M6281 Muscle weakness (generalized): Secondary | ICD-10-CM | POA: Insufficient documentation

## 2022-01-29 NOTE — Therapy (Signed)
[place] [facility] ?[address] ?[place], [place], [address] ?Phone: [phone]   Fax:  [phone] ? ?Occupational Therapy Treatment ? ?Patient Details  ?Name: Christy Mills ?MRN: [id number] ?Date of Birth: [birth year] ?Referring Provider (OT): DR Rudene Christians ? ? ?Encounter Date: 01/29/2022 ? ? OT End of Session - 01/29/22 1345   ? ? Visit Number 2   ? Number of Visits 8   ? Date for OT Re-Evaluation 03/05/22   ? OT Start Time 1345   ? OT Stop Time 1426   ? OT Time Calculation (min) 41 min   ? Activity Tolerance Patient tolerated treatment well   ? Behavior During Therapy [facility] for tasks assessed/performed   ? ?  ?  ? ?  ? ? ?Past Medical History:  ?Diagnosis Date  ? Anxiety 1980  ? difficulty sleeping after mother died.  ? Arthritis   ? Basal cell carcinoma (BCC) of skin of right upper lip   ? S/P MOHS Dr. Ishmael Holter [facility] 10/24/2018  ? Cancer [facility])   ? skin resected from nose in 2014.  ? History of trigger finger   ? Hyperlipidemia   ? Hypertension   ? Osteoporosis   ? ? ?Past Surgical History:  ?Procedure Laterality Date  ? APPENDECTOMY  1978  ? CATARACT EXTRACTION W/ INTRAOCULAR LENS IMPLANT Bilateral 2004  ? one eye than the other a month laster  ? HYSTEROSCOPY WITH D & C N/A 08/10/2016  ? Procedure: DILATATION AND CURETTAGE /HYSTEROSCOPY;  Surgeon: Honor Loh Ward, MD;  Location: [facility] ORS;  Service: Gynecology;  Laterality: N/A;  ? JOINT REPLACEMENT Right 2015  ? MOHS SURGERY  2014  ? nose  ? MOHS SURGERY Right 10/24/2018  ? upper lip Ishmael Holter [facility] 10/24/2018  ? OOPHORECTOMY Right   ? PAROTID GLAND TUMOR EXCISION Left   ? [place]; benign warthins tumor  ? TOTAL KNEE ARTHROPLASTY Right 10/2013  ? ? ?There were no vitals filed for this visit. ? ? Subjective Assessment - 01/29/22 1345   ? ? Subjective  I did all the exercises you told me.  I worked in the yard for 3 hours of the day had no trouble I can make a fist.  But cannot straighten it   ? Pertinent History  Christy Mills is a 88 y.o. female who presents today s/p Left ring trigger finger release 01/01/22 by Dr. Rudene Christians. On 01/15/22 suture removed , pt's  pain is improving. No swelling warmth erythema or drainage. She continues have a flexion contracture of the left ring PIP joint. She is been working on some gentle stretching exercises. Refer to OT   ? Patient Stated Goals I want to be able to use my hand and gripping and opening it.   ? Currently in Pain? --   no able to provide nr on pain scale  ? ?  ?  ? ?  ? ? ? ? ? ? ? ? ? ? ? ? ? ? ? OT Treatments/Exercises (OP) - 01/29/22 0001   ? ?  ? Moist Heat Therapy  ? Number Minutes Moist Heat 8 Minutes   ? Moist Heat Location Hand   LMB splint -2  3 min on 4th PIP for ext  ? ?  ?  ? ?  ? ?Patient educated on home program use moist heat-patient to do morning and later afternoon second session prior to motion and massage ?Patient was fitted and educated on use  of LMB extension splint for PIP.  Reinforced several times during session for patient to only use 3 minutes at a time.  But she can do it several times during the day with moist heat or after Epsom salt bath ? ?Followed by rolling into digit extension over foam roller 20 reps for soft tissue massage increasing PIP extension ?Reviewed with patient soft tissue massage over volar side of digit-done mini massager today as well as brushing of Grasston tool #2 on volar digit-showed increased PIP extension. ? ?Digit extension into the table with slight pressure behind fourth PIP 10 reps holding 5 to 10 seconds. ?Then MC flexion with PIP extension- able to do better today and done PROM in this position by OT  ?Followed by intrinsic a fist - still limited DIP and intrinsic of flexion.(Pt to to force it) ?Then composite fist 10 reps ?Reinforced with patient to do not forceful flexion or squeezing but only light motion with gentle passive range of motion ?Patient able to do composite fist touching palm with no triggering in session  after addressing PIP extension, ?Patient limited with DIP flexion  ?Reviewed with patient soft tissue massage and use of LMB splint ?Patient verbalize and demonstrate understanding of using of LMB splint.  ? ? ? ? ? ? ? OT Education - 01/29/22 1345   ? ? Education Details progress and changes to HEP   ? Person(s) Educated Patient   ? Methods Explanation;Handout;Tactile cues;Demonstration   ? Comprehension Verbalized understanding;Returned demonstration;Verbal cues required;Tactile cues required   ? ?  ?  ? ?  ? ? ? OT Short Term Goals - 01/22/22 1437   ? ?  ? OT SHORT TERM GOAL #1  ? Title Patient to be independent in home program to decrease flexor contracture and maintain flexion touching palm at left fourth digit PIP   ? Baseline Stiffness in the fourth digit in the morning especially during the day improving but decrease DIP for flexion, PIP flexion contracture of -20   ? Time 3   ? Period Weeks   ? Status New   ? Target Date 02/12/22   ? ?  ?  ? ?  ? ? ? ? OT Long Term Goals - 01/22/22 1438   ? ?  ? OT LONG TERM GOAL #1  ? Title Left fourth digit PIP extension improve to less than -10 degrees for patient to donn and doff gloves and hand in pocket while maintianing flexion of 4th   ? Baseline Left fourth PIP flexion contracture -20, increase stiffness with flexion after working or addressing extension of fourth digit PIP   ? Time 6   ? Period Weeks   ? Status New   ? Target Date 03/05/22   ?  ? OT LONG TERM GOAL #2  ? Title L 4th digit flexion improve with pain minimal to squeez washcloth and grip increase to more than 50% compare to L to carry groceries or bag   ? Baseline tenderness at PIP 4th - severe pain at night 4th PIP - - decrease DIP flexion- stiffness in am and after working on extnetion- NT grip 3 wks s/p   ? Time 6   ? Period Weeks   ? Status New   ? Target Date 03/05/22   ? ?  ?  ? ?  ? ? ? ? ? ? ? ? Plan - 01/29/22 1346   ? ? Clinical Impression Statement Patient referred to OT with postop left  fourth trigger finger release on 01/01/2022.  Patient report her finger was locked down for 3 weeks and flexion prior to surgery.  Patient presents at eval  with increase pain at night and fourth digit and tenderness at PIP joint.  Patient does state coming in with able to touch palm for fisting and report able to use hand and yard.  No pain with making a fist denies any locking or clicking.    This date and session patient able to do L 4th MC flexion 90, PIP 100 but decrease DIP flexion -and flexor contracture of -30 degrees the state coming in.  Patient was fitted with a LMB extension PIP splint-reinforced with patient several times through the session to only do 2 x 30 minutes at a time can do it several times during the day.  Patient did show increase PIP extension after using LMB splint and soft tissue massage.  Patient HOH and needs step-by-step review of home programs as well as repetition, tactile cues and demonstration.  Patient has less tenderness at PIP joint this date compared to eval.  Patient limited in use of left hand and ADLs and IADLs and can benefit from OT services.   ? OT Occupational Profile and History Problem Focused Assessment - Including review of records relating to presenting problem   ? Occupational performance deficits (Please refer to evaluation for details): ADL's;IADL's;Play;Leisure   ? Body Structure / Function / Physical Skills ADL;Pain;Dexterity;Edema;UE functional use;Flexibility;Scar mobility   ? Rehab Potential Fair   ? Comorbidities Affecting Occupational Performance: None   ? Modification or Assistance to Complete Evaluation  No modification of tasks or assist necessary to complete eval   ? OT Frequency --   1-2 x wk  ? OT Duration 6 weeks   ? OT Treatment/Interventions Self-care/ADL training;Moist Heat;Fluidtherapy;Splinting;Therapeutic exercise;Paraffin;Manual Therapy;Patient/family education;Passive range of motion;Scar mobilization   ? Consulted and Agree with Plan of Care  Patient   ? ?  ?  ? ?  ? ? ?Patient will benefit from skilled therapeutic intervention in order to improve the following deficits and impairments:   ?Body Structure / Function / Physical Skills: ADL, Pain,

## 2022-02-05 ENCOUNTER — Ambulatory Visit: Payer: PPO | Admitting: Occupational Therapy

## 2022-02-05 DIAGNOSIS — M79642 Pain in left hand: Secondary | ICD-10-CM

## 2022-02-05 DIAGNOSIS — L905 Scar conditions and fibrosis of skin: Secondary | ICD-10-CM

## 2022-02-05 DIAGNOSIS — M6281 Muscle weakness (generalized): Secondary | ICD-10-CM

## 2022-02-05 DIAGNOSIS — M25642 Stiffness of left hand, not elsewhere classified: Secondary | ICD-10-CM | POA: Diagnosis not present

## 2022-02-05 NOTE — Therapy (Signed)
[place] ?[facility] ?[address] ?[place], [place], [address] ?Phone: [phone]   Fax:  [phone] ? ?Occupational Therapy Treatment ? ?Patient Details  ?Name: Christy Mills ?MRN: [id number] ?Date of Birth: 22-Aug-1934 ?Referring Provider (OT): DR Rudene Christians ? ? ?Encounter Date: 02/05/2022 ? ? OT End of Session - 02/05/22 1425   ? ? Visit Number 3   ? Number of Visits 8   ? Date for OT Re-Evaluation 03/05/22   ? OT Start Time 1425   ? OT Stop Time 1510   ? OT Time Calculation (min) 45 min   ? Activity Tolerance Patient tolerated treatment well   ? Behavior During Therapy [place] for tasks assessed/performed   ? ?  ?  ? ?  ? ? ?Past Medical History:  ?Diagnosis Date  ? Anxiety 1980  ? difficulty sleeping after mother died.  ? Arthritis   ? Basal cell carcinoma (BCC) of skin of right upper lip   ? S/P MOHS [facility] 10/24/2018  ? Cancer [facility])   ? skin resected from nose in 2014.  ? History of trigger finger   ? Hyperlipidemia   ? Hypertension   ? Osteoporosis   ? ? ?Past Surgical History:  ?Procedure Laterality Date  ? APPENDECTOMY  1978  ? CATARACT EXTRACTION W/ INTRAOCULAR LENS IMPLANT Bilateral 2004  ? one eye than the other a month laster  ? HYSTEROSCOPY WITH D & C N/A 08/10/2016  ? Procedure: DILATATION AND CURETTAGE /HYSTEROSCOPY;  Surgeon: Honor Loh Ward, MD;  Location: [facility] ORS;  Service: Gynecology;  Laterality: N/A;  ? JOINT REPLACEMENT Right 2015  ? MOHS SURGERY  2014  ? nose  ? MOHS SURGERY Right 10/24/2018  ? upper lip Ishmael Holter [facility] 10/24/2018  ? OOPHORECTOMY Right   ? PAROTID GLAND TUMOR EXCISION Left   ? [place]; benign warthins tumor  ? TOTAL KNEE ARTHROPLASTY Right 10/2013  ? ? ?There were no vitals filed for this visit. ? ? Subjective Assessment - 02/05/22 1425   ? ? Subjective  I did everything you told me today.  I only kept the spring splints on for 3 minutes at a time.  I had my trigger finger probably to 3 years before I had a shot I  think   ? Pertinent History BRILYN Mills is a 88 y.o. female who presents today s/p Left ring trigger finger release 01/01/22 by Dr. Rudene Christians. On 01/15/22 suture removed , pt's  pain is improving. No swelling warmth erythema or drainage. She continues have a flexion contracture of the left ring PIP joint. She is been working on some gentle stretching exercises. Refer to OT   ? Patient Stated Goals I want to be able to use my hand and gripping and opening it.   ? Currently in Pain? --   unable to state nr on pain scale  ? ?  ?  ? ?  ? ? ? ? ? [place] OT Assessment - 02/05/22 0001   ? ?  ? Left Hand AROM  ? L Ring  MCP 0-90 90 Degrees   ? L Ring PIP 0-100 100 Degrees   -20  ? L Ring DIP 0-70 50 Degrees   ? ?  ?  ? ?  ? ? ? ? ? ? ? ? ? ? ? OT Treatments/Exercises (OP) - 02/05/22 0001   ? ?  ? LUE Paraffin  ? Number Minutes Paraffin 8 Minutes   ?  LUE Paraffin Location Hand   ? Comments LMB  splint on for PIP extention prior to ext splint fabrication   ? ?  ?  ? ?  ? ?  Patient starting to show more hyperextension of MP with continuing flexion contracture at -20 to -30 at PIP ?Even with patient using her dynamic LMB splint and using home program patient continues to be limited and extension at PIP. ?Patient  to cont with LMB extension splint for PIP.  Reinforced several times during session for patient to only use 3 minutes at a time. But if no increase pain or edema increase to 2 x 5 min at time.  ? ? ?Followed by rolling into digit extension over foam roller 20 reps for soft tissue massage increasing PIP extension ?Reviewed with patient soft tissue massage over volar side of digit-done mini massager today as well as brushing of Grasston tool #2 on volar digit-showed increased PIP extension. ? ?  ?Digit extension into the table with slight pressure behind fourth PIP 10 reps holding 5 to 10 seconds. ?Then MC flexion with PIP extension- able to do better today and done PROM in this position by OT  ?Was able to get full extension  on volar side, PIP joint is enlarge. ? ?Fabricated for patient a volar gutter splint for PIP extension to use at nighttime,was able to do splint at nearly 0 degrees of extension.   ?Educated patient on donning and doffing as well as wearing reinforced with patient she can start with doing 2 hours at a time then increase to 4 to 6 hours pain-free.   ?Patient to do home program with LMB splint and heat with massage prior to donning gutter splint for nighttime.   ?Reviewed with patient soft tissue massage and use of LMB splint ?Patient verbalize and demonstrate understanding of using of LMB splint and gutter splint  ?  ? ? ? ? ? ? OT Education - 02/05/22 1425   ? ? Education Details progress and changes to HEP   ? Person(s) Educated Patient   ? Methods Explanation;Handout;Tactile cues;Demonstration   ? Comprehension Verbalized understanding;Returned demonstration;Verbal cues required;Tactile cues required   ? ?  ?  ? ?  ? ? ? OT Short Term Goals - 01/22/22 1437   ? ?  ? OT SHORT TERM GOAL #1  ? Title Patient to be independent in home program to decrease flexor contracture and maintain flexion touching palm at left fourth digit PIP   ? Baseline Stiffness in the fourth digit in the morning especially during the day improving but decrease DIP for flexion, PIP flexion contracture of -20   ? Time 3   ? Period Weeks   ? Status New   ? Target Date 02/12/22   ? ?  ?  ? ?  ? ? ? ? OT Long Term Goals - 01/22/22 1438   ? ?  ? OT LONG TERM GOAL #1  ? Title Left fourth digit PIP extension improve to less than -10 degrees for patient to donn and doff gloves and hand in pocket while maintianing flexion of 4th   ? Baseline Left fourth PIP flexion contracture -20, increase stiffness with flexion after working or addressing extension of fourth digit PIP   ? Time 6   ? Period Weeks   ? Status New   ? Target Date 03/05/22   ?  ? OT LONG TERM GOAL #2  ? Title L 4th digit flexion improve with pain minimal  to [employer] and grip  increase to more than 50% compare to L to carry groceries or bag   ? Baseline tenderness at PIP 4th - severe pain at night 4th PIP - - decrease DIP flexion- stiffness in am and after working on extnetion- NT grip 3 wks s/p   ? Time 6   ? Period Weeks   ? Status New   ? Target Date 03/05/22   ? ?  ?  ? ?  ? ? ? ? ? ? ? ? Plan - 02/05/22 1425   ? ? Clinical Impression Statement Patient referred to OT with postop left fourth trigger finger release on 01/01/2022.  Patient report her finger was locked down for 3 weeks and flexion prior to surgery.  Patient presents at eval  with increase pain and tenderness at PIP joint. .  No pain with making a fist denies any locking or clicking.    This date and session patient able to do L 4th MC flexion 90, PIP 100 but decrease  and DIP flexion increase to WNL touching palm. Flexor contracture -20 to -30 degrees.  Patient reports that she was wearing the LMB extension PIP splint on 3 minutes at a time during the day but did not see as much progress.  Would recommend at this time and fabricated for patient PIP extension gutter splint to use at nighttime after doing some passive range of motion after moist heat.  Reviewed with patient several times again home program and that flexion is not the problem.  Focusing at this stage at PIP extension because of long.  Of having trigger finger issues.  HOH and needs step-by-step review of home programs as well as repetition, tactile cues and demonstration.  Patient has less tenderness at PIP joint this date compared to eval.  Patient limited in use of left hand and ADLs and IADLs and can benefit from OT services.   ? OT Occupational Profile and History Problem Focused Assessment - Including review of records relating to presenting problem   ? Occupational performance deficits (Please refer to evaluation for details): ADL's;IADL's;Play;Leisure   ? Body Structure / Function / Physical Skills ADL;Pain;Dexterity;Edema;UE functional  use;Flexibility;Scar mobility   ? Rehab Potential Fair   ? Clinical Decision Making Limited treatment options, no task modification necessary   ? Comorbidities Affecting Occupational Performance: None   ? Modification or Ass

## 2022-02-12 ENCOUNTER — Ambulatory Visit: Payer: PPO | Admitting: Occupational Therapy

## 2022-02-12 DIAGNOSIS — M25642 Stiffness of left hand, not elsewhere classified: Secondary | ICD-10-CM | POA: Diagnosis not present

## 2022-02-12 DIAGNOSIS — M6281 Muscle weakness (generalized): Secondary | ICD-10-CM

## 2022-02-12 DIAGNOSIS — L905 Scar conditions and fibrosis of skin: Secondary | ICD-10-CM

## 2022-02-12 DIAGNOSIS — M79642 Pain in left hand: Secondary | ICD-10-CM

## 2022-02-12 NOTE — Therapy (Signed)
[place] ?[facility] ?[address] ?[place], [place], [address] ?Phone: [phone]   Fax:  [phone] ? ?Occupational Therapy Treatment ? ?Patient Details  ?Name: Christy Mills ?MRN: [id number] ?Date of Birth: 15-Apr-1934 ?Referring Provider (OT): DR Rudene Christians ? ? ?Encounter Date: 02/12/2022 ? ? OT End of Session - 02/12/22 1207   ? ? Visit Number 4   ? Number of Visits 8   ? Date for OT Re-Evaluation 03/05/22   ? OT Start Time 1155   ? OT Stop Time 1240   ? OT Time Calculation (min) 45 min   ? Activity Tolerance Patient tolerated treatment well   ? Behavior During Therapy [facility] for tasks assessed/performed   ? ?  ?  ? ?  ? ? ?Past Medical History:  ?Diagnosis Date  ? Anxiety 1980  ? difficulty sleeping after mother died.  ? Arthritis   ? Basal cell carcinoma (BCC) of skin of right upper lip   ? S/P MOHS Dr. Ishmael Holter [facility] 10/24/2018  ? Cancer [facility])   ? skin resected from nose in 2014.  ? History of trigger finger   ? Hyperlipidemia   ? Hypertension   ? Osteoporosis   ? ? ?Past Surgical History:  ?Procedure Laterality Date  ? APPENDECTOMY  1978  ? CATARACT EXTRACTION W/ INTRAOCULAR LENS IMPLANT Bilateral 2004  ? one eye than the other a month laster  ? HYSTEROSCOPY WITH D & C N/A 08/10/2016  ? Procedure: DILATATION AND CURETTAGE /HYSTEROSCOPY;  Surgeon: Honor Loh Ward, MD;  Location: [facility] ORS;  Service: Gynecology;  Laterality: N/A;  ? JOINT REPLACEMENT Right 2015  ? MOHS SURGERY  2014  ? nose  ? MOHS SURGERY Right 10/24/2018  ? upper lip Ishmael Holter [facility] 10/24/2018  ? OOPHORECTOMY Right   ? PAROTID GLAND TUMOR EXCISION Left   ? [place]; benign warthins tumor  ? TOTAL KNEE ARTHROPLASTY Right 10/2013  ? ? ?There were no vitals filed for this visit. ? ? Subjective Assessment - 02/12/22 1206   ? ? Subjective  I lost the little spring splint I could not find it anywhere.  I did wear the little white splint after the second night. I could wear it all night starting 2nd  night.   ? Pertinent History Christy Mills is a 88 y.o. female who presents today s/p Left ring trigger finger release 01/01/22 by Dr. Rudene Christians. On 01/15/22 suture removed , pt's  pain is improving. No swelling warmth erythema or drainage. She continues have a flexion contracture of the left ring PIP joint. She is been working on some gentle stretching exercises. Refer to OT   ? Patient Stated Goals I want to be able to use my hand and gripping and opening it.   ? Currently in Pain? No/denies   ? ?  ?  ? ?  ? ? ? ? ? [place] OT Assessment - 02/12/22 0001   ? ?  ? Left Hand AROM  ? L Ring  MCP 0-90 90 Degrees   ? L Ring PIP 0-100 100 Degrees   coming in - 0 in session  ? L Ring DIP 0-70 50 Degrees   ? ?  ?  ? ?  ? ? ? ? ? ? ? ? ? ? ? OT Treatments/Exercises (OP) - 02/12/22 0001   ? ?  ? LUE Paraffin  ? Number Minutes Paraffin 8 Minutes   ? LUE Paraffin Location Hand   ?  Comments LMBsplint for 4th PIP ext   ? ?  ?  ? ?  ? ? ?  Patient arrived today with PIP extension of fourth digit -20 degrees.  Patient reports she lost her LMB dynamic splint over the weekend but was using her white gutter splint at nighttime. ?Patient continues to be tender over dorsal PIP with pressure during PIP extension stretch. ?Dorsal PIP joint and large-measuring lateral to PIP done starting next session. ?Patient  to cont with LMB extension splint for PIP during the day 3 times for about 5 minutes without increase edema or pain.   ?  ?  ?Soft tissue massage over volar side of digit-done mini massager today as well as brushing of Grasston tool #2 on volar digit-showed increased PIP extension to 0 degrees  ?After using LMB during paraffin prior to soft tissue. ?Done joint mobs - light traction and mobilization prior to passive range of motion into extension mostly. ?  ?  ?Digit extension into the table with slight pressure behind fourth PIP 10 reps holding 5 to 10 seconds. ?Then MC flexion with PIP extension- able to do better today and done PROM in  this position by OT  ?Was able to get full extension on volar side. ?  ?Fabricated last visit for patient a volar gutter splint for PIP extension to use at nighttime,was able to do splint at nearly 0 degrees of extension.   ?Modified splint with adding 2 patches of moleskin on distal and to increase PIP extension and splint.  Modified proximal strap to and reviewed with patient again about pressure to make sure increase PIP extension stretch in gutter splint. ?Reviewed with patient donning and doffing as well as wearing reinforced with patient she can start with doing 2 hours at a time then increase to 4 to 6 hours pain-free.   ?Patient to do home program with LMB splint and heat with massage prior to donning gutter splint for nighttime.   ?Reviewed with patient soft tissue massage and use of LMB splint ?Patient verbalize and demonstrate understanding of using of LMB splint and gutter splint  ? ? ? ? ? ? OT Education - 02/12/22 1207   ? ? Education Details progress and changes to HEP   ? Person(s) Educated Patient   ? Methods Explanation;Handout;Tactile cues;Demonstration   ? Comprehension Verbalized understanding;Returned demonstration;Verbal cues required;Tactile cues required   ? ?  ?  ? ?  ? ? ? OT Short Term Goals - 01/22/22 1437   ? ?  ? OT SHORT TERM GOAL #1  ? Title Patient to be independent in home program to decrease flexor contracture and maintain flexion touching palm at left fourth digit PIP   ? Baseline Stiffness in the fourth digit in the morning especially during the day improving but decrease DIP for flexion, PIP flexion contracture of -20   ? Time 3   ? Period Weeks   ? Status New   ? Target Date 02/12/22   ? ?  ?  ? ?  ? ? ? ? OT Long Term Goals - 01/22/22 1438   ? ?  ? OT LONG TERM GOAL #1  ? Title Left fourth digit PIP extension improve to less than -10 degrees for patient to donn and doff gloves and hand in pocket while maintianing flexion of 4th   ? Baseline Left fourth PIP flexion contracture  -20, increase stiffness with flexion after working or addressing extension of fourth digit PIP   ?  Time 6   ? Period Weeks   ? Status New   ? Target Date 03/05/22   ?  ? OT LONG TERM GOAL #2  ? Title L 4th digit flexion improve with pain minimal to squeez washcloth and grip increase to more than 50% compare to L to carry groceries or bag   ? Baseline tenderness at PIP 4th - severe pain at night 4th PIP - - decrease DIP flexion- stiffness in am and after working on extnetion- NT grip 3 wks s/p   ? Time 6   ? Period Weeks   ? Status New   ? Target Date 03/05/22   ? ?  ?  ? ?  ? ? ? ? ? ? ? ? Plan - 02/12/22 1207   ? ? Clinical Impression Statement Patient referred to OT with postop left fourth trigger finger release on 01/01/2022.  Patient report her finger was locked down for 3 weeks in flexion prior to surgery.  Patient presents at eval  with increase pain and tenderness at PIP joint. .  No pain with making a fist denies any locking or clicking.  Pt flexion of 4th digit  WNL touching palm.  Flexor contracture -20 degrees coming in today   Patient reports that she was wearing the LMB extension PIP splint on 5 minutes at a time during the day but lost it.  Since last visit patient wearing PIP extension gutter splint at nighttime.  Was able to state to get full extension in session for the first time since start of care..  Reviewed with patient several times again home program and that flexion is not the problem.  Focusing at this stage at PIP extension.  HOH and needs step-by-step review of home programs as well as repetition, tactile cues and demonstration.  Patient tenderness is mostly on dorsal side of PIP during PIP extension stretch.  Patient responded good today with paraffin using LMB extension splint together with soft tissue on volar digit prior to passive range of motion.  Patient limited in use of left hand and ADLs and IADLs and can benefit from OT services.   ? OT Occupational Profile and History Problem  Focused Assessment - Including review of records relating to presenting problem   ? Occupational performance deficits (Please refer to evaluation for details): ADL's;IADL's;Play;Leisure   ? Body Structure /

## 2022-02-19 ENCOUNTER — Ambulatory Visit: Payer: PPO | Admitting: Occupational Therapy

## 2022-02-19 DIAGNOSIS — M79675 Pain in left toe(s): Secondary | ICD-10-CM | POA: Diagnosis not present

## 2022-02-19 DIAGNOSIS — M6281 Muscle weakness (generalized): Secondary | ICD-10-CM

## 2022-02-19 DIAGNOSIS — L905 Scar conditions and fibrosis of skin: Secondary | ICD-10-CM

## 2022-02-19 DIAGNOSIS — M25642 Stiffness of left hand, not elsewhere classified: Secondary | ICD-10-CM

## 2022-02-19 DIAGNOSIS — B351 Tinea unguium: Secondary | ICD-10-CM | POA: Diagnosis not present

## 2022-02-19 DIAGNOSIS — M79674 Pain in right toe(s): Secondary | ICD-10-CM | POA: Diagnosis not present

## 2022-02-19 DIAGNOSIS — M79642 Pain in left hand: Secondary | ICD-10-CM

## 2022-02-19 NOTE — Therapy (Signed)
[place] [facility] [address], [place], [address] Phone: [phone]   Fax:  [phone]  Occupational Therapy Treatment  Patient Details  Name: Christy Mills MRN: [id number] Date of Birth: 1933/12/01 Referring Provider (OT): DR Rudene Christians   Encounter Date: 02/19/2022   OT End of Session - 02/19/22 1323     Visit Number 5    Number of Visits 8    Date for OT Re-Evaluation 03/05/22    OT Start Time 1220    OT Stop Time 1250    OT Time Calculation (min) 30 min    Activity Tolerance Patient tolerated treatment well    Behavior During Therapy [facility] for tasks assessed/performed             Past Medical History:  Diagnosis Date   Anxiety 1980   difficulty sleeping after mother died.   Arthritis    Basal cell carcinoma (BCC) of skin of right upper lip    S/P MOHS Dr. Ishmael Holter [facility] 10/24/2018   Cancer ([place])    skin resected from nose in 2014.   History of trigger finger    Hyperlipidemia    Hypertension    Osteoporosis     Past Surgical History:  Procedure Laterality Date   APPENDECTOMY  1978   CATARACT EXTRACTION W/ INTRAOCULAR LENS IMPLANT Bilateral 2004   one eye than the other a month laster   HYSTEROSCOPY WITH D & C N/A 08/10/2016   Procedure: DILATATION AND CURETTAGE /HYSTEROSCOPY;  Surgeon: Honor Loh Ward, MD;  Location: [facility] ORS;  Service: Gynecology;  Laterality: N/A;   JOINT REPLACEMENT Right 2015   MOHS SURGERY  2014   nose   MOHS SURGERY Right 10/24/2018   upper lip Ishmael Holter [facility] 10/24/2018   OOPHORECTOMY Right    PAROTID GLAND TUMOR EXCISION Left    [facility]; benign warthins tumor   TOTAL KNEE ARTHROPLASTY Right 10/2013    There were no vitals filed for this visit.   Subjective Assessment - 02/19/22 1322     Subjective  My fingers really straigth some mornings when I get up wearing that splint.  I do do the spring splint and I do try and do my exercises 2 times or 3 times a day.     Pertinent History Christy Mills is a 88 y.o. female who presents today s/p Left ring trigger finger release 01/01/22 by Dr. Rudene Christians. On 01/15/22 suture removed , pt's  pain is improving. No swelling warmth erythema or drainage. She continues have a flexion contracture of the left ring PIP joint. She is been working on some gentle stretching exercises. Refer to OT    Patient Stated Goals I want to be able to use my hand and gripping and opening it.    Currently in Pain? No/denies                [facility] OT Assessment - 02/19/22 0001       Left Hand AROM   L Ring  MCP 0-90 90 Degrees    L Ring PIP 0-100 100 Degrees   -25 coming in dorsal side - 0degrees in session   L Ring DIP 0-70 60 Degrees                      OT Treatments/Exercises (OP) - 02/19/22 0001       LUE Paraffin   Number Minutes Paraffin 8 Minutes    LUE  Paraffin Location Hand    Comments LMB splint for fourth digit PIP extension and heating pad around             Patient arrived today with PIP extension of fourth digit -25 degrees at the dorsal side of PIP.  Patient reported using her splint at nighttime and most of the mornings her fingers strength.  Patient continues to have enlarged PIP joint-reinforced with patient to look on volar side of fourth digit for extension progress.  Patient  to cont with LMB extension splint for PIP during the day 3 times for about 5 minutes without increase edema or pain.   Patient with less pain at the PIP joint and with range of motion of fourth PIP.   Soft tissue massage over volar side of digit-done mini massager today as well as brushing of Grasston tool #2 on volar digit-showed increased PIP extension to 0 degrees  After using LMB during paraffin prior to soft tissue. Done joint mobs - light traction and mobilization prior to passive range of motion into extension mostly.     Digit extension into the table with slight pressure behind fourth PIP 10 reps holding 5 to  10 seconds. Then MC flexion with PIP extension- able to do better today and done PROM in this position by OT and reviewed with patient several times today Was able to get full extension on volar side.   Patient to continue wearing the PIP extension gutter splint at nighttime fabricated 2 weeks ago.  And modified last time.   Patient to do home program with LMB splint and heat with massage prior to donning gutter splint for nighttime.   Reviewed with patient soft tissue massage and use of LMB splint Patient verbalize and demonstrate understanding of using of LMB splint and gutter splint  Patient to follow-up this time in 2 weeks         OT Education - 02/19/22 1323     Education Details progress and changes to HEP    Person(s) Educated Patient    Methods Explanation;Handout;Tactile cues;Demonstration    Comprehension Verbalized understanding;Returned demonstration;Verbal cues required;Tactile cues required              OT Short Term Goals - 01/22/22 1437       OT SHORT TERM GOAL #1   Title Patient to be independent in home program to decrease flexor contracture and maintain flexion touching palm at left fourth digit PIP    Baseline Stiffness in the fourth digit in the morning especially during the day improving but decrease DIP for flexion, PIP flexion contracture of -20    Time 3    Period Weeks    Status New    Target Date 02/12/22               OT Long Term Goals - 01/22/22 1438       OT LONG TERM GOAL #1   Title Left fourth digit PIP extension improve to less than -10 degrees for patient to donn and doff gloves and hand in pocket while maintianing flexion of 4th    Baseline Left fourth PIP flexion contracture -20, increase stiffness with flexion after working or addressing extension of fourth digit PIP    Time 6    Period Weeks    Status New    Target Date 03/05/22      OT LONG TERM GOAL #2   Title L 4th digit flexion improve with pain minimal to [employer]  washcloth and grip increase to more than 50% compare to L to carry groceries or bag    Baseline tenderness at PIP 4th - severe pain at night 4th PIP - - decrease DIP flexion- stiffness in am and after working on extnetion- NT grip 3 wks s/p    Time 6    Period Weeks    Status New    Target Date 03/05/22                   Plan - 02/19/22 1324     Clinical Impression Statement Patient referred to OT with postop left fourth trigger finger release on 01/01/2022.  Patient report her finger was locked down for 3 weeks in flexion prior to surgery.  Patient presents at eval  with increase pain and tenderness at PIP joint. .  No pain with making a fist denies any locking or clicking.  Pt flexion of 4th digit  WNL touching palm.  Flexor contracture -25 degrees coming in today on dorsal side - but less volar   Patient reports that she was wearing the LMB extension PIP splint on 5 minutes at a time during the day.   Pt wearing PIP extention splint for 2 wks now at night time -she reports most of the mornings her fingers all the way straight coming out of the splint.  Able in session to get full extension at PIP extension..  Reviewed with patient several times again home program and that flexion is not the problem.  Focusing at this stage at PIP extension and looking at the volar side because of PIP joint being in large.Marland Kitchen  HOH and needs step-by-step review of home programs as well as repetition, tactile cues and demonstration.  Pain and tenderness at PIP joint as well as scar much better.  Patient responded great with paraffin using LMB extension splint together with soft tissue on volar digit prior to passive range of motion.  Patient limited in use of left hand and ADLs and IADLs and can benefit from OT services.    OT Occupational Profile and History Problem Focused Assessment - Including review of records relating to presenting problem    Occupational performance deficits (Please refer to evaluation for  details): ADL's;IADL's;Play;Leisure    Body Structure / Function / Physical Skills ADL;Pain;Dexterity;Edema;UE functional use;Flexibility;Scar mobility    Rehab Potential Fair    Clinical Decision Making Limited treatment options, no task modification necessary    Comorbidities Affecting Occupational Performance: None    Modification or Assistance to Complete Evaluation  No modification of tasks or assist necessary to complete eval    OT Frequency Biweekly    OT Duration 4 weeks    OT Treatment/Interventions Self-care/ADL training;Moist Heat;Fluidtherapy;Splinting;Therapeutic exercise;Paraffin;Manual Therapy;Patient/family education;Passive range of motion;Scar mobilization    Consulted and Agree with Plan of Care Patient             Patient will benefit from skilled therapeutic intervention in order to improve the following deficits and impairments:   Body Structure / Function / Physical Skills: ADL, Pain, Dexterity, Edema, UE functional use, Flexibility, Scar mobility       Visit Diagnosis: Stiffness of left hand, not elsewhere classified  Scar condition and fibrosis of skin  Pain in left hand  Muscle weakness (generalized)    Problem List Patient Active Problem List   Diagnosis Date Noted   Bilateral edema of lower extremity 22/29/7989   Systolic murmur 21/19/4174   History of transient ischemic attack (TIA) 02/16/2020  HOH (hard of hearing) 07/07/2019   Primary insomnia 07/07/2019   Primary osteoarthritis of left knee 07/24/2017   Thyroid nodule 02/23/2017   Acid reflux 12/19/2015   Urinary frequency 12/19/2015   Arthritis 09/12/2015   BP (high blood pressure) 09/12/2015   Anxiety 05/26/2015   Hyperlipemia 03/24/2015   Osteoporosis 03/24/2015    Rosalyn Gess, OTR/L,CLT 02/19/2022, 1:29 PM  [facility] [address] [place], [address] Phone: [phone]   Fax:   [phone]  Name: Christy Mills MRN: [id number] Date of Birth: 1934-05-20

## 2022-02-23 ENCOUNTER — Encounter: Payer: PPO | Admitting: Occupational Therapy

## 2022-03-05 ENCOUNTER — Ambulatory Visit: Payer: PPO | Attending: Orthopedic Surgery | Admitting: Occupational Therapy

## 2022-03-05 DIAGNOSIS — M25642 Stiffness of left hand, not elsewhere classified: Secondary | ICD-10-CM | POA: Diagnosis not present

## 2022-03-05 DIAGNOSIS — L905 Scar conditions and fibrosis of skin: Secondary | ICD-10-CM | POA: Diagnosis not present

## 2022-03-05 DIAGNOSIS — M6281 Muscle weakness (generalized): Secondary | ICD-10-CM | POA: Diagnosis not present

## 2022-03-05 DIAGNOSIS — M79642 Pain in left hand: Secondary | ICD-10-CM | POA: Diagnosis not present

## 2022-03-05 NOTE — Therapy (Signed)
[facility] [address], [place], [address] Phone: [phone]   Fax:  [phone]  Occupational Therapy Treatment  Patient Details  Name: Christy Mills MRN: [id number] Date of Birth: 01-Dec-1933 Referring Provider (OT): DR Rudene Christians   Encounter Date: 03/05/2022   OT End of Session - 03/05/22 1230     Visit Number 6    Number of Visits 8    Date for OT Re-Evaluation 03/05/22    OT Start Time 1200    OT Stop Time 1218    OT Time Calculation (min) 18 min    Activity Tolerance Patient tolerated treatment well    Behavior During Therapy [facility] for tasks assessed/performed             Past Medical History:  Diagnosis Date   Anxiety 1980   difficulty sleeping after mother died.   Arthritis    Basal cell carcinoma (BCC) of skin of right upper lip    S/P MOHS Dr. Ishmael Holter [facility] 10/24/2018   Cancer ([place])    skin resected from nose in 2014.   History of trigger finger    Hyperlipidemia    Hypertension    Osteoporosis     Past Surgical History:  Procedure Laterality Date   APPENDECTOMY  1978   CATARACT EXTRACTION W/ INTRAOCULAR LENS IMPLANT Bilateral 2004   one eye than the other a month laster   HYSTEROSCOPY WITH D & C N/A 08/10/2016   Procedure: DILATATION AND CURETTAGE /HYSTEROSCOPY;  Surgeon: Honor Loh Ward, MD;  Location: [facility] ORS;  Service: Gynecology;  Laterality: N/A;   JOINT REPLACEMENT Right 2015   MOHS SURGERY  2014   nose   MOHS SURGERY Right 10/24/2018   upper lip Ishmael Holter [facility] 10/24/2018   OOPHORECTOMY Right    PAROTID GLAND TUMOR EXCISION Left    [facility]; benign warthins tumor   TOTAL KNEE ARTHROPLASTY Right 10/2013    There were no vitals filed for this visit.   Subjective Assessment - 03/05/22 1224     Subjective  My finger is straight in the morning and as the day goes it bend -I have no pain I am using it and everything with no problems    Pertinent History Christy Mills  is a 88 y.o. female who presents today s/p Left ring trigger finger release 01/01/22 by Dr. Rudene Christians. On 01/15/22 suture removed , pt's  pain is improving. No swelling warmth erythema or drainage. She continues have a flexion contracture of the left ring PIP joint. She is been working on some gentle stretching exercises. Refer to OT    Patient Stated Goals I want to be able to use my hand and gripping and opening it.    Currently in Pain? No/denies                [facility] OT Assessment - 03/05/22 0001       Left Hand AROM   L Ring  MCP 0-90 90 Degrees    L Ring PIP 0-100 100 Degrees   0 in session, per pt in am   L Ring DIP 0-70 60 Degrees                 Patient arrived today with PIP extension of fourth digit -20 degrees at the dorsal side of PIP.  Patient reported using her splint at nighttime and in the mornings her fingers straight.  Patient continues to have enlarged PIP joint-reinforced  with patient to look on volar side of fourth digit for extension progress.  Patient  to cont with LMB extension splint for PIP during the day 3 times for about 5 -10 minutes without increase edema or pain.   Patient with less pain at the PIP joint  flexion AROM WNL .   Soft tissue massage over volar side of digit-done mini massager today as well as brushing of [place] tool #2 on volar digit-showed increased PIP extension to 0 degrees  Did not had to do splinting or paraffin      Digit extension into the table with slight pressure behind fourth PIP 10 reps holding 5 to 10 seconds. Then MC flexion with PIP extension- able to do better today and done PROM in this position by OT and reviewed with patient several times today Was able to get full extension on volar side.   Patient to continue wearing the PIP extension gutter splint at nighttime - review with pt again donning correctly and wearing of splint  Pt to cont with splinting and HEP for month - and call for check up otherwise will discharge her                   OT Education - 03/05/22 1229     Education Details Use of splint and continue home program    Person(s) Educated Patient    Methods Explanation;Handout;Tactile cues;Demonstration    Comprehension Verbalized understanding;Returned demonstration;Verbal cues required;Tactile cues required              OT Short Term Goals - 03/05/22 1234       OT SHORT TERM GOAL #1   Title Patient to be independent in home program to decrease flexor contracture and maintain flexion touching palm at left fourth digit PIP    Baseline Stiffness in the fourth digit in the morning especially during the day improving but decrease DIP for flexion, PIP flexion contracture of -20 NOW full flexion, decreased extension during the day but able to maintain during home program and with nighttime splint.    Status Partially Met               OT Long Term Goals - 03/05/22 1234       OT LONG TERM GOAL #1   Title Left fourth digit PIP extension improve to less than -10 degrees for patient to donn and doff gloves and hand in pocket while maintianing flexion of 4th    Baseline Left fourth PIP flexion contracture -20, increase stiffness with flexion after working or addressing extension of fourth digit PIP NOW patient with full extension fourth PIP in the mornings after splinting as well as after doing some massage and stretches 3 times a day.  Able to get gloves on hand in pocket.    Status Achieved      OT LONG TERM GOAL #2   Title L 4th digit flexion improve with pain minimal to squeez washcloth and grip increase to more than 50% compare to L to carry groceries or bag    Baseline tenderness at PIP 4th - severe pain at night 4th PIP - - decrease DIP flexion- stiffness in am and after working on extnetion- NOW flexion and gripping within normal limits - no issues per pt                   Plan - 03/05/22 1231     Clinical Impression Statement Patient referred to OT with  postop  left fourth trigger finger release on 01/01/2022.  Patient report her finger was locked down for 3 weeks in flexion prior to surgery.  Patient presents at eval  with increase pain and tenderness at PIP joint. .  Patient is 8 weeks postop with normal active range of motion for flexion.  Denies no pain or clicking and locking.  Able to use it normally in all her activities.  With extensor gutter splint wearing at nighttime patient has full extension at PIP in the morning but PIP joint on dorsal side and large.  As the day goes patient show increase flexion but able to use normally.  Patient continue with home program for splinting, soft tissue and range of motion for another month until 3 months postop patient can call me if needed follow-up.  Patient has this date full extension at PIP with only soft tissue massage no heat or splinting.  Soft tissue on volar digit prior to passive range of motion.  Patient to follow-up with me in a month if needed.  We will otherwise discharge.    OT Occupational Profile and History Problem Focused Assessment - Including review of records relating to presenting problem    Occupational performance deficits (Please refer to evaluation for details): ADL's;IADL's;Play;Leisure    Body Structure / Function / Physical Skills ADL;Pain;Dexterity;Edema;UE functional use;Flexibility;Scar mobility    Rehab Potential Fair    Clinical Decision Making Limited treatment options, no task modification necessary    Comorbidities Affecting Occupational Performance: None    Modification or Assistance to Complete Evaluation  No modification of tasks or assist necessary to complete eval    OT Frequency Monthly    OT Duration 4 weeks    OT Treatment/Interventions Self-care/ADL training;Moist Heat;Fluidtherapy;Splinting;Therapeutic exercise;Paraffin;Manual Therapy;Patient/family education;Passive range of motion;Scar mobilization    Consulted and Agree with Plan of Care Patient              Patient will benefit from skilled therapeutic intervention in order to improve the following deficits and impairments:   Body Structure / Function / Physical Skills: ADL, Pain, Dexterity, Edema, UE functional use, Flexibility, Scar mobility       Visit Diagnosis: Stiffness of left hand, not elsewhere classified  Scar condition and fibrosis of skin  Pain in left hand  Muscle weakness (generalized)    Problem List Patient Active Problem List   Diagnosis Date Noted   Bilateral edema of lower extremity 23/76/2831   Systolic murmur 51/76/1607   History of transient ischemic attack (TIA) 02/16/2020   HOH (hard of hearing) 07/07/2019   Primary insomnia 07/07/2019   Primary osteoarthritis of left knee 07/24/2017   Thyroid nodule 02/23/2017   Acid reflux 12/19/2015   Urinary frequency 12/19/2015   Arthritis 09/12/2015   BP (high blood pressure) 09/12/2015   Anxiety 05/26/2015   Hyperlipemia 03/24/2015   Osteoporosis 03/24/2015    Rosalyn Gess, OT 03/05/2022, 12:38 PM  [facility] [address] [place], [address] Phone: [phone]   Fax:  [phone]  Name: Christy Mills MRN: [id number] Date of Birth: 1934-09-22

## 2022-03-06 NOTE — Progress Notes (Deleted)
Established patient visit   Patient: Christy Mills   DOB: May 11, 1934   88 y.o. Female  MRN: [id number] Visit Date: 03/09/2022  Today's healthcare provider: Lelon Huh, MD   No chief complaint on file.  Subjective    HPI  Hypertension, follow-up  BP Readings from Last 3 Encounters:  09/01/21 132/71  05/16/21 (!) 145/68  04/23/21 (!) 166/67   Wt Readings from Last 3 Encounters:  09/01/21 183 lb (83 kg)  05/16/21 184 lb (83.5 kg)  04/23/21 188 lb (85.3 kg)     She was last seen for hypertension 6 months ago.  BP at that visit was 132/71. Management since that visit includes continuing same treatment.  She reports {excellent/good/fair/poor:19665} compliance with treatment. She {is/is not:9024} having side effects. {document side effects if present:1} She is following a {diet:21022986} diet. She {is/is not:9024} exercising. She {does/does not:200015} smoke.  Use of agents associated with hypertension: NSAIDS.   Outside blood pressures are {***enter patient reported home BP readings, or 'not being checked':1}. Symptoms: {Yes/No:20286} chest pain {Yes/No:20286} chest pressure  {Yes/No:20286} palpitations {Yes/No:20286} syncope  {Yes/No:20286} dyspnea {Yes/No:20286} orthopnea  {Yes/No:20286} paroxysmal nocturnal dyspnea {Yes/No:20286} lower extremity edema   Pertinent labs Lab Results  Component Value Date   CHOL 189 09/01/2021   HDL 46 09/01/2021   LDLCALC 117 (H) 09/01/2021   TRIG 144 09/01/2021   CHOLHDL 4.1 09/01/2021   Lab Results  Component Value Date   NA 144 09/01/2021   K 4.0 09/01/2021   CREATININE 0.69 09/01/2021   EGFR 85 09/01/2021   GLUCOSE 87 09/01/2021   TSH 1.100 09/01/2021     The ASCVD Risk score (Arnett DK, et al., 2019) failed to calculate for the following reasons:   The 2019 ASCVD risk score is only valid for ages 17 to 32  ---------------------------------------------------------------------------------------------------    Medications: Outpatient Medications Prior to Visit  Medication Sig   acetaminophen (TYLENOL) 500 MG tablet Take 500 mg by mouth.    amLODipine (NORVASC) 5 MG tablet TAKE 1 TABLET BY MOUTH EVERY DAY   aspirin EC 81 MG tablet Take 162 mg by mouth.    calcium carbonate (OSCAL) 1500 (600 Ca) MG TABS tablet Take 600 mg of elemental calcium by mouth 2 (two) times daily with a meal.    furosemide (LASIX) 20 MG tablet TAKE 1 TABLET BY MOUTH EVERY DAY AS NEEDED   lisinopril (ZESTRIL) 40 MG tablet TAKE 1 TABLET BY MOUTH EVERY DAY   LORazepam (ATIVAN) 1 MG tablet TAKE 1 TABLET BY MOUTH TWICE A DAY AS NEEDED   meloxicam (MOBIC) 7.5 MG tablet TAKE 1 TABLET BY MOUTH EVERY DAY AS NEEDED   Multiple Vitamins-Minerals (CENTRUM SILVER 50+WOMEN) TABS Take 1 tablet by mouth daily.   omeprazole (PRILOSEC) 20 MG capsule TAKE 1 CAPSULE BY MOUTH EVERY DAY   potassium chloride (KLOR-CON) 10 MEQ tablet TAKE 1 TABLET (10 MEQ TOTAL) BY MOUTH DAILY. ON DAYS SHE TAKES FUROSEMIDE   simvastatin (ZOCOR) 20 MG tablet TAKE 1 TABLET BY MOUTH EVERY DAY   No facility-administered medications prior to visit.    Review of Systems  {Labs  Heme  Chem  Endocrine  Serology  Results Review (optional):23779}   Objective    There were no vitals taken for this visit. {Show previous vital signs (optional):23777}  Physical Exam  ***  No results found for any visits on 03/09/22.  Assessment & Plan     ***  No follow-ups on file.      {  provider attestation***:1}   Lelon Huh, MD  [facility] [phone] (phone) [phone] (fax)  [place]

## 2022-03-09 ENCOUNTER — Ambulatory Visit: Payer: PPO | Admitting: Family Medicine

## 2022-03-12 ENCOUNTER — Other Ambulatory Visit: Payer: Self-pay | Admitting: Family Medicine

## 2022-03-12 DIAGNOSIS — I10 Essential (primary) hypertension: Secondary | ICD-10-CM

## 2022-03-12 DIAGNOSIS — K219 Gastro-esophageal reflux disease without esophagitis: Secondary | ICD-10-CM

## 2022-03-12 MED ORDER — OMEPRAZOLE 20 MG PO CPDR: DELAYED_RELEASE_CAPSULE | ORAL | 4 refills | Status: AC

## 2022-03-12 NOTE — Addendum Note (Signed)
Addended by: Lelon Huh E on: 03/12/2022 02:02 PM   Modules accepted: Orders

## 2022-03-13 DIAGNOSIS — D1722 Benign lipomatous neoplasm of skin and subcutaneous tissue of left arm: Secondary | ICD-10-CM | POA: Diagnosis not present

## 2022-03-13 DIAGNOSIS — H918X3 Other specified hearing loss, bilateral: Secondary | ICD-10-CM | POA: Diagnosis not present

## 2022-03-13 DIAGNOSIS — N3281 Overactive bladder: Secondary | ICD-10-CM | POA: Diagnosis not present

## 2022-03-13 DIAGNOSIS — I10 Essential (primary) hypertension: Secondary | ICD-10-CM | POA: Diagnosis not present

## 2022-03-13 DIAGNOSIS — Z780 Asymptomatic menopausal state: Secondary | ICD-10-CM | POA: Diagnosis not present

## 2022-03-13 DIAGNOSIS — E782 Mixed hyperlipidemia: Secondary | ICD-10-CM | POA: Diagnosis not present

## 2022-03-16 ENCOUNTER — Telehealth: Payer: Self-pay

## 2022-03-16 NOTE — Telephone Encounter (Signed)
Copied from [place] [phone]. Topic: Appointment Scheduling - Scheduling Inquiry for Clinic >> Mar 16, 2022 10:27 AM Sabas Sous wrote: Reason for CRM: Pt is switching PCPs. Leaving practice

## 2022-03-23 ENCOUNTER — Ambulatory Visit: Payer: PPO | Admitting: Family Medicine

## 2022-06-13 DIAGNOSIS — Z79899 Other long term (current) drug therapy: Secondary | ICD-10-CM | POA: Diagnosis not present

## 2022-06-13 DIAGNOSIS — F411 Generalized anxiety disorder: Secondary | ICD-10-CM | POA: Diagnosis not present

## 2022-06-13 DIAGNOSIS — L03116 Cellulitis of left lower limb: Secondary | ICD-10-CM | POA: Diagnosis not present

## 2022-06-13 DIAGNOSIS — Z780 Asymptomatic menopausal state: Secondary | ICD-10-CM | POA: Diagnosis not present

## 2022-06-13 DIAGNOSIS — I10 Essential (primary) hypertension: Secondary | ICD-10-CM | POA: Diagnosis not present

## 2022-06-13 DIAGNOSIS — E782 Mixed hyperlipidemia: Secondary | ICD-10-CM | POA: Diagnosis not present

## 2022-06-13 DIAGNOSIS — N3281 Overactive bladder: Secondary | ICD-10-CM | POA: Diagnosis not present

## 2022-06-29 DIAGNOSIS — H26491 Other secondary cataract, right eye: Secondary | ICD-10-CM | POA: Diagnosis not present

## 2022-07-05 ENCOUNTER — Other Ambulatory Visit: Payer: Self-pay | Admitting: Family Medicine

## 2022-07-05 DIAGNOSIS — M2550 Pain in unspecified joint: Secondary | ICD-10-CM

## 2022-07-13 ENCOUNTER — Other Ambulatory Visit: Payer: Self-pay | Admitting: Family Medicine

## 2022-07-13 DIAGNOSIS — I10 Essential (primary) hypertension: Secondary | ICD-10-CM

## 2022-07-15 ENCOUNTER — Other Ambulatory Visit: Payer: Self-pay | Admitting: Family Medicine

## 2022-07-15 DIAGNOSIS — I10 Essential (primary) hypertension: Secondary | ICD-10-CM

## 2022-09-07 DIAGNOSIS — Z780 Asymptomatic menopausal state: Secondary | ICD-10-CM | POA: Diagnosis not present

## 2022-09-07 DIAGNOSIS — L03116 Cellulitis of left lower limb: Secondary | ICD-10-CM | POA: Diagnosis not present

## 2022-09-07 DIAGNOSIS — I10 Essential (primary) hypertension: Secondary | ICD-10-CM | POA: Diagnosis not present

## 2022-09-14 DIAGNOSIS — D1722 Benign lipomatous neoplasm of skin and subcutaneous tissue of left arm: Secondary | ICD-10-CM | POA: Diagnosis not present

## 2022-09-14 DIAGNOSIS — E782 Mixed hyperlipidemia: Secondary | ICD-10-CM | POA: Diagnosis not present

## 2022-09-14 DIAGNOSIS — F411 Generalized anxiety disorder: Secondary | ICD-10-CM | POA: Diagnosis not present

## 2022-09-14 DIAGNOSIS — N3281 Overactive bladder: Secondary | ICD-10-CM | POA: Diagnosis not present

## 2022-09-14 DIAGNOSIS — Z0000 Encounter for general adult medical examination without abnormal findings: Secondary | ICD-10-CM | POA: Diagnosis not present

## 2022-09-14 DIAGNOSIS — I10 Essential (primary) hypertension: Secondary | ICD-10-CM | POA: Diagnosis not present

## 2022-10-10 ENCOUNTER — Other Ambulatory Visit: Payer: Self-pay | Admitting: Family Medicine

## 2022-10-10 DIAGNOSIS — E785 Hyperlipidemia, unspecified: Secondary | ICD-10-CM

## 2022-10-10 NOTE — Telephone Encounter (Signed)
Pt has new PCP- end date with prescriber (Dr. Caryn Section) 03/15/22. Requested Prescriptions  Refused Prescriptions Disp Refills   simvastatin (ZOCOR) 20 MG tablet [Pharmacy Med Name: SIMVASTATIN 20 MG TABLET] 90 tablet 4    Sig: TAKE 1 TABLET BY MOUTH EVERY DAY     Cardiovascular:  Antilipid - Statins Failed - 10/10/2022  1:21 AM      Failed - Valid encounter within last 12 months    Recent Outpatient Visits           1 year ago Annual physical exam   [facility] Birdie Sons, MD   1 year ago Contusion of hip, unspecified laterality, subsequent encounter   [facility] Birdie Sons, MD   2 years ago Primary hypertension   [facility] Birdie Sons, MD   2 years ago Essential hypertension   [facility] Birdie Sons, MD   3 years ago Medicare annual wellness visit, subsequent   [place], [place]              Failed - Lipid Panel in normal range within the last 12 months    Cholesterol, Total  Date Value Ref Range Status  09/01/2021 189 100 - 199 mg/dL Final   LDL Chol Calc (NIH)  Date Value Ref Range Status  09/01/2021 117 (H) 0 - 99 mg/dL Final   HDL  Date Value Ref Range Status  09/01/2021 46 >39 mg/dL Final   Triglycerides  Date Value Ref Range Status  09/01/2021 144 0 - 149 mg/dL Final         Passed - Patient is not pregnant      '

## 2022-12-25 DIAGNOSIS — R1032 Left lower quadrant pain: Secondary | ICD-10-CM | POA: Diagnosis not present

## 2022-12-25 DIAGNOSIS — Z780 Asymptomatic menopausal state: Secondary | ICD-10-CM | POA: Diagnosis not present

## 2022-12-25 DIAGNOSIS — I10 Essential (primary) hypertension: Secondary | ICD-10-CM | POA: Diagnosis not present

## 2022-12-25 DIAGNOSIS — Z1231 Encounter for screening mammogram for malignant neoplasm of breast: Secondary | ICD-10-CM | POA: Diagnosis not present

## 2022-12-25 DIAGNOSIS — R109 Unspecified abdominal pain: Secondary | ICD-10-CM | POA: Diagnosis not present

## 2022-12-25 DIAGNOSIS — E782 Mixed hyperlipidemia: Secondary | ICD-10-CM | POA: Diagnosis not present

## 2022-12-27 ENCOUNTER — Other Ambulatory Visit: Payer: Self-pay

## 2022-12-27 DIAGNOSIS — I10 Essential (primary) hypertension: Secondary | ICD-10-CM

## 2022-12-27 DIAGNOSIS — R1032 Left lower quadrant pain: Secondary | ICD-10-CM

## 2023-01-10 ENCOUNTER — Ambulatory Visit
Admission: RE | Admit: 2023-01-10 | Discharge: 2023-01-10 | Disposition: A | Discharge status: Home / Self Care | Payer: PPO | Source: Ambulatory Visit | Attending: Internal Medicine | Admitting: Internal Medicine

## 2023-01-10 DIAGNOSIS — N281 Cyst of kidney, acquired: Secondary | ICD-10-CM | POA: Diagnosis not present

## 2023-01-10 DIAGNOSIS — I10 Essential (primary) hypertension: Secondary | ICD-10-CM | POA: Insufficient documentation

## 2023-01-10 DIAGNOSIS — K5730 Diverticulosis of large intestine without perforation or abscess without bleeding: Secondary | ICD-10-CM | POA: Diagnosis not present

## 2023-01-10 DIAGNOSIS — R1032 Left lower quadrant pain: Secondary | ICD-10-CM | POA: Diagnosis not present

## 2023-01-10 LAB — POCT I-STAT CREATININE: Creatinine, Ser: 0.7 mg/dL (ref 0.44–1.00)

## 2023-01-10 MED ORDER — IOHEXOL 300 MG/ML  SOLN
100.0000 mL | Freq: Once | INTRAMUSCULAR | Status: AC | PRN
  Administered 2023-01-10: 100 mL via INTRAVENOUS

## 2023-03-26 ENCOUNTER — Other Ambulatory Visit: Payer: Self-pay | Admitting: Physician Assistant

## 2023-03-26 ENCOUNTER — Ambulatory Visit
Admission: RE | Admit: 2023-03-26 | Discharge: 2023-03-26 | Disposition: A | Discharge status: Home / Self Care | Payer: PPO | Source: Ambulatory Visit | Attending: Physician Assistant | Admitting: Physician Assistant

## 2023-03-26 DIAGNOSIS — M7989 Other specified soft tissue disorders: Secondary | ICD-10-CM | POA: Insufficient documentation

## 2023-03-26 DIAGNOSIS — I10 Essential (primary) hypertension: Secondary | ICD-10-CM | POA: Diagnosis not present

## 2023-03-26 DIAGNOSIS — R339 Retention of urine, unspecified: Secondary | ICD-10-CM | POA: Diagnosis not present

## 2023-04-12 ENCOUNTER — Other Ambulatory Visit: Payer: Self-pay | Admitting: Family Medicine

## 2023-04-12 DIAGNOSIS — I10 Essential (primary) hypertension: Secondary | ICD-10-CM

## 2023-04-17 DIAGNOSIS — R339 Retention of urine, unspecified: Secondary | ICD-10-CM | POA: Diagnosis not present

## 2023-04-17 DIAGNOSIS — I82451 Acute embolism and thrombosis of right peroneal vein: Secondary | ICD-10-CM | POA: Diagnosis not present

## 2023-04-17 DIAGNOSIS — M7989 Other specified soft tissue disorders: Secondary | ICD-10-CM | POA: Diagnosis not present

## 2023-04-17 DIAGNOSIS — I10 Essential (primary) hypertension: Secondary | ICD-10-CM | POA: Diagnosis not present

## 2023-04-30 ENCOUNTER — Other Ambulatory Visit: Payer: Self-pay | Admitting: Family Medicine

## 2023-04-30 DIAGNOSIS — I10 Essential (primary) hypertension: Secondary | ICD-10-CM

## 2023-06-14 DIAGNOSIS — F411 Generalized anxiety disorder: Secondary | ICD-10-CM | POA: Diagnosis not present

## 2023-06-14 DIAGNOSIS — I10 Essential (primary) hypertension: Secondary | ICD-10-CM | POA: Diagnosis not present

## 2023-06-14 DIAGNOSIS — E782 Mixed hyperlipidemia: Secondary | ICD-10-CM | POA: Diagnosis not present

## 2023-06-14 DIAGNOSIS — R739 Hyperglycemia, unspecified: Secondary | ICD-10-CM | POA: Diagnosis not present

## 2023-08-09 ENCOUNTER — Other Ambulatory Visit: Payer: Self-pay | Admitting: Family Medicine

## 2023-08-09 DIAGNOSIS — M2550 Pain in unspecified joint: Secondary | ICD-10-CM

## 2023-08-09 NOTE — Telephone Encounter (Signed)
Please advise 

## 2023-10-15 DIAGNOSIS — R739 Hyperglycemia, unspecified: Secondary | ICD-10-CM | POA: Diagnosis not present

## 2023-10-15 DIAGNOSIS — I10 Essential (primary) hypertension: Secondary | ICD-10-CM | POA: Diagnosis not present

## 2023-10-22 DIAGNOSIS — E782 Mixed hyperlipidemia: Secondary | ICD-10-CM | POA: Diagnosis not present

## 2023-10-22 DIAGNOSIS — I10 Essential (primary) hypertension: Secondary | ICD-10-CM | POA: Diagnosis not present

## 2023-10-22 DIAGNOSIS — M7989 Other specified soft tissue disorders: Secondary | ICD-10-CM | POA: Diagnosis not present

## 2023-10-22 DIAGNOSIS — F411 Generalized anxiety disorder: Secondary | ICD-10-CM | POA: Diagnosis not present

## 2023-10-22 DIAGNOSIS — Z0000 Encounter for general adult medical examination without abnormal findings: Secondary | ICD-10-CM | POA: Diagnosis not present

## 2023-10-22 DIAGNOSIS — I82451 Acute embolism and thrombosis of right peroneal vein: Secondary | ICD-10-CM | POA: Diagnosis not present

## 2023-10-23 ENCOUNTER — Other Ambulatory Visit: Payer: Self-pay | Admitting: Internal Medicine

## 2023-10-23 DIAGNOSIS — M7989 Other specified soft tissue disorders: Secondary | ICD-10-CM

## 2023-10-29 ENCOUNTER — Ambulatory Visit
Admission: RE | Admit: 2023-10-29 | Discharge: 2023-10-29 | Disposition: A | Discharge status: Home / Self Care | Payer: PPO | Source: Ambulatory Visit | Attending: Internal Medicine | Admitting: Internal Medicine

## 2023-10-29 DIAGNOSIS — I82401 Acute embolism and thrombosis of unspecified deep veins of right lower extremity: Secondary | ICD-10-CM | POA: Diagnosis not present

## 2023-10-29 DIAGNOSIS — M7989 Other specified soft tissue disorders: Secondary | ICD-10-CM | POA: Diagnosis not present

## 2023-10-29 DIAGNOSIS — R600 Localized edema: Secondary | ICD-10-CM | POA: Diagnosis not present

## 2024-01-28 DIAGNOSIS — I10 Essential (primary) hypertension: Secondary | ICD-10-CM | POA: Diagnosis not present

## 2024-01-28 DIAGNOSIS — M7989 Other specified soft tissue disorders: Secondary | ICD-10-CM | POA: Diagnosis not present

## 2024-01-31 DIAGNOSIS — M7989 Other specified soft tissue disorders: Secondary | ICD-10-CM | POA: Diagnosis not present

## 2024-01-31 DIAGNOSIS — I10 Essential (primary) hypertension: Secondary | ICD-10-CM | POA: Diagnosis not present

## 2024-04-17 DIAGNOSIS — Z0283 Encounter for blood-alcohol and blood-drug test: Secondary | ICD-10-CM | POA: Diagnosis not present

## 2024-04-24 DIAGNOSIS — M7989 Other specified soft tissue disorders: Secondary | ICD-10-CM | POA: Diagnosis not present

## 2024-04-24 DIAGNOSIS — E782 Mixed hyperlipidemia: Secondary | ICD-10-CM | POA: Diagnosis not present

## 2024-04-24 DIAGNOSIS — I10 Essential (primary) hypertension: Secondary | ICD-10-CM | POA: Diagnosis not present

## 2024-04-24 DIAGNOSIS — F411 Generalized anxiety disorder: Secondary | ICD-10-CM | POA: Diagnosis not present

## 2024-05-13 DIAGNOSIS — Z0283 Encounter for blood-alcohol and blood-drug test: Secondary | ICD-10-CM | POA: Diagnosis not present

## 2024-07-27 DIAGNOSIS — F411 Generalized anxiety disorder: Secondary | ICD-10-CM | POA: Diagnosis not present

## 2024-07-27 DIAGNOSIS — I10 Essential (primary) hypertension: Secondary | ICD-10-CM | POA: Diagnosis not present

## 2024-07-27 DIAGNOSIS — M810 Age-related osteoporosis without current pathological fracture: Secondary | ICD-10-CM | POA: Diagnosis not present

## 2024-07-27 DIAGNOSIS — E782 Mixed hyperlipidemia: Secondary | ICD-10-CM | POA: Diagnosis not present

## 2024-07-27 DIAGNOSIS — Z23 Encounter for immunization: Secondary | ICD-10-CM | POA: Diagnosis not present

## 2024-07-27 DIAGNOSIS — I83893 Varicose veins of bilateral lower extremities with other complications: Secondary | ICD-10-CM | POA: Diagnosis not present

## 2024-09-20 ENCOUNTER — Other Ambulatory Visit: Payer: Self-pay | Admitting: Family Medicine

## 2024-09-20 DIAGNOSIS — M2550 Pain in unspecified joint: Secondary | ICD-10-CM

## 2024-09-26 ENCOUNTER — Other Ambulatory Visit: Payer: Self-pay | Admitting: Family Medicine

## 2024-09-26 DIAGNOSIS — M2550 Pain in unspecified joint: Secondary | ICD-10-CM
